# Patient Record
Sex: Male | Born: 1970 | Hispanic: Yes | Marital: Single | Smoking: Never smoker
Health system: Southern US, Community
[De-identification: ages and names within clinical notes are randomized; demographics above are authoritative.]

## PROBLEM LIST (undated history)

## (undated) DIAGNOSIS — N200 Calculus of kidney: Secondary | ICD-10-CM

## (undated) HISTORY — PX: LITHOTRIPSY: SUR834

---

## 2006-09-13 ENCOUNTER — Emergency Department: Payer: Self-pay | Admitting: Unknown Physician Specialty

## 2010-01-15 ENCOUNTER — Emergency Department: Payer: Self-pay | Admitting: Emergency Medicine

## 2010-01-15 IMAGING — CT CT STONE STUDY
1 of 2 series · 16 of 32 positions shown, 20 images · non-contrast
Comparison: none

REASON FOR EXAM: left flank pain, nausea
COMMENTS:

PROCEDURE:     CT  - CT ABDOMEN /PELVIS WO (STONE)  - [DATE]  [DATE]
RESULT:     History history: Left flank pain.
Comparison studies: No prior.

[Series 2: stone · axial · 0.72mm/px · z∈[-476,-14]mm · 16 of 168 slices shown, 20 images]
[im 7/168  soft-tissue]
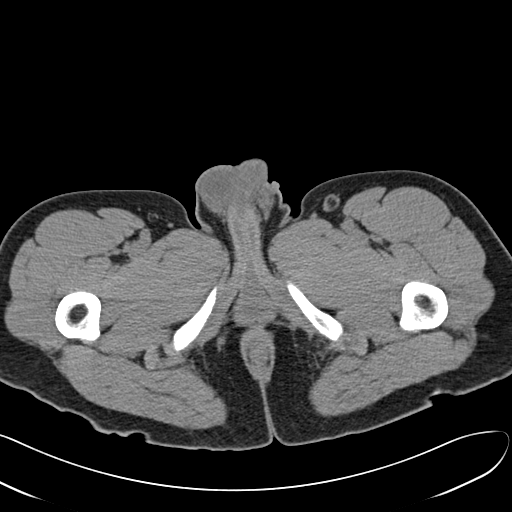
[im 7/168  bone]
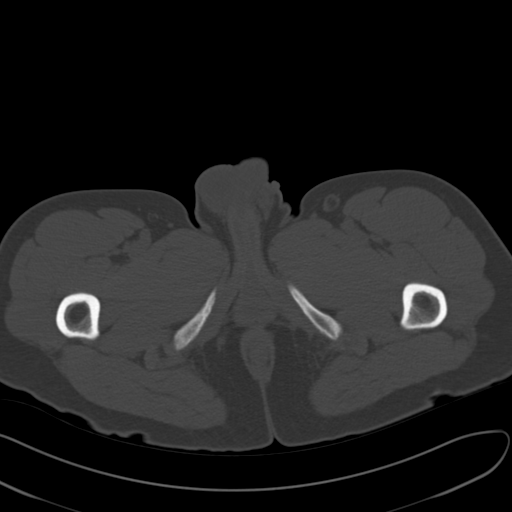
[im 19/168  soft-tissue]
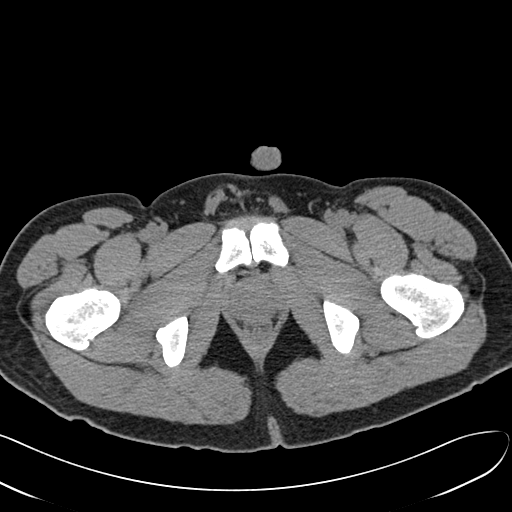
[im 31/168  soft-tissue]
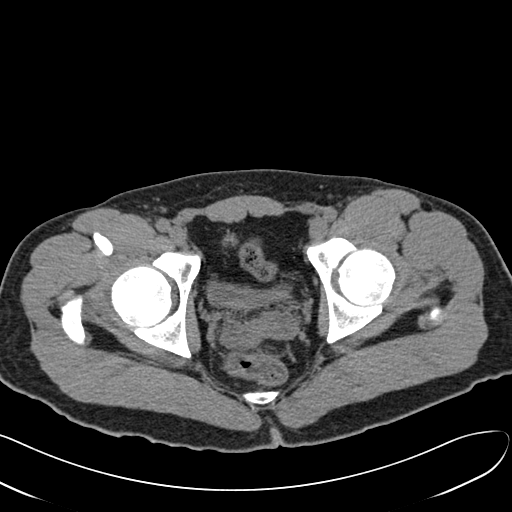
[im 44/168  soft-tissue]
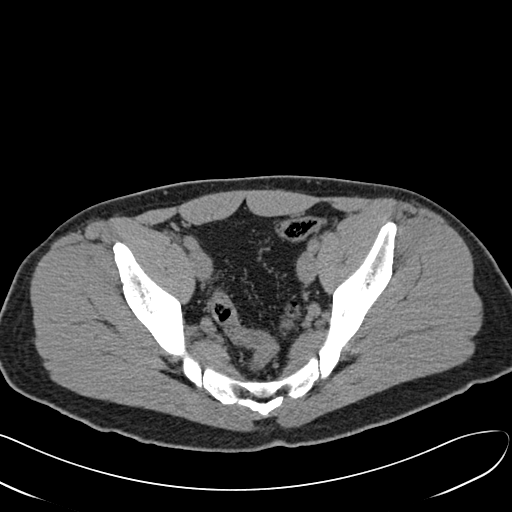
[im 56/168  soft-tissue]
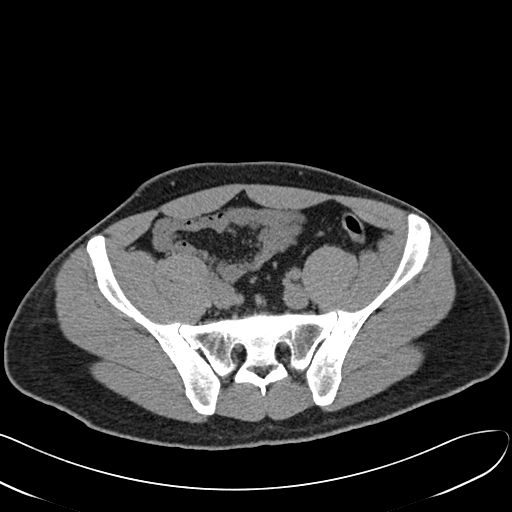
[im 69/168  soft-tissue]
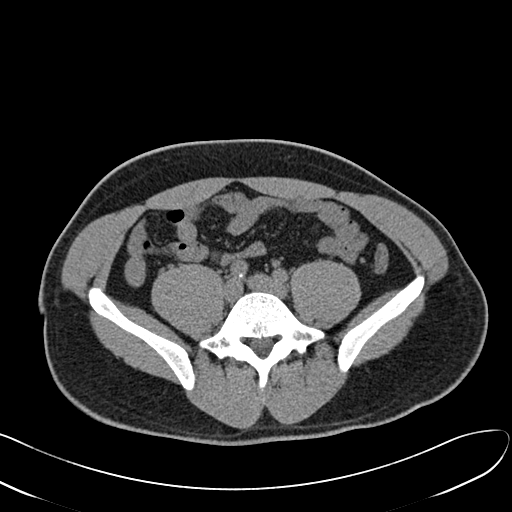
[im 81/168  soft-tissue]
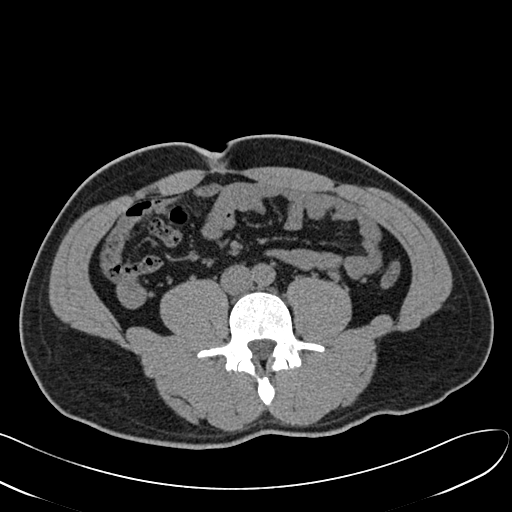
[im 87/168  soft-tissue]
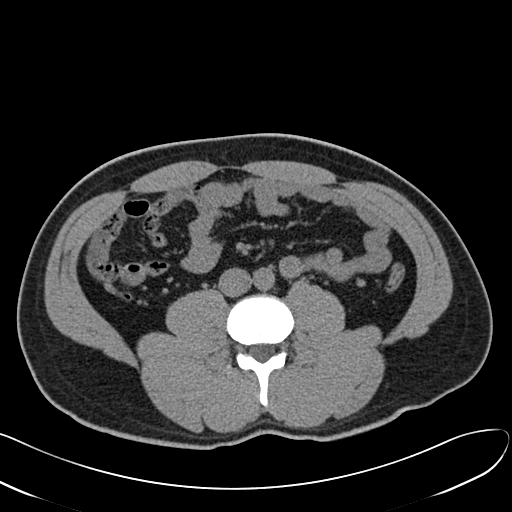
[im 99/168  soft-tissue]
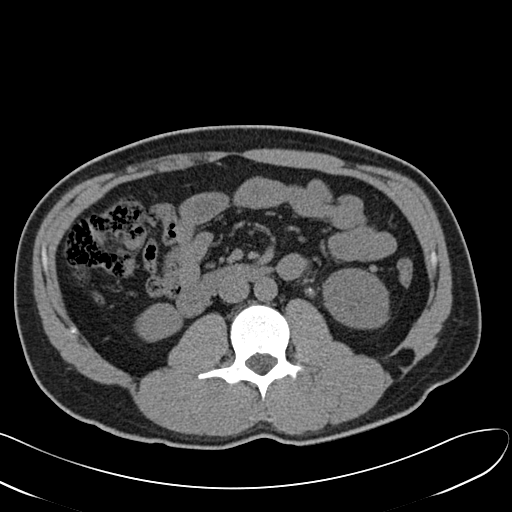
[im 99/168  bone]
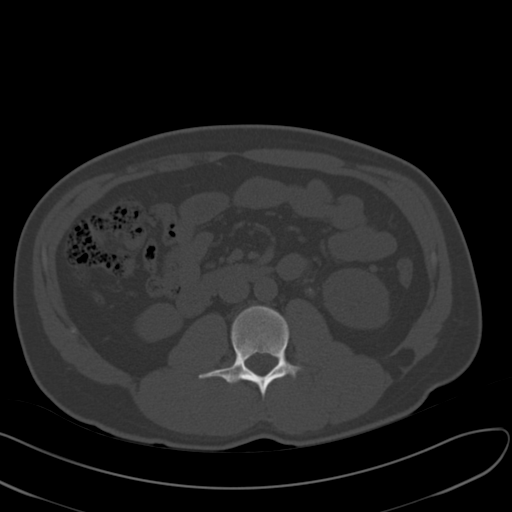
[im 112/168  soft-tissue]
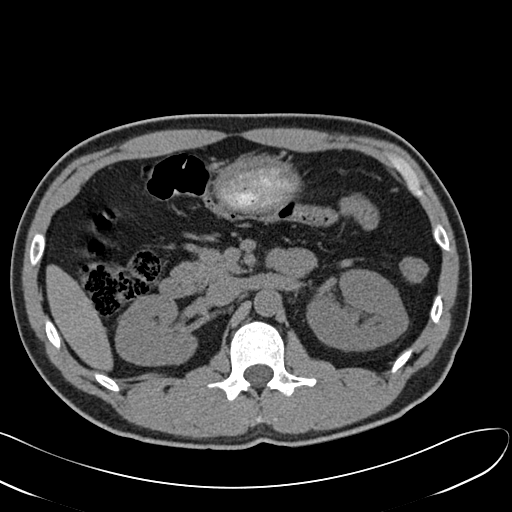
[im 124/168  soft-tissue]
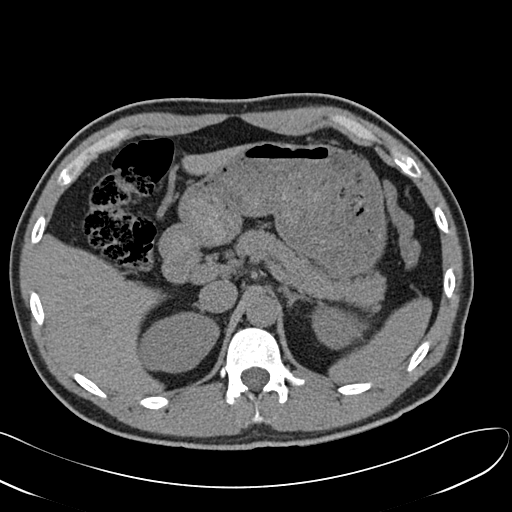
[im 137/168  soft-tissue]
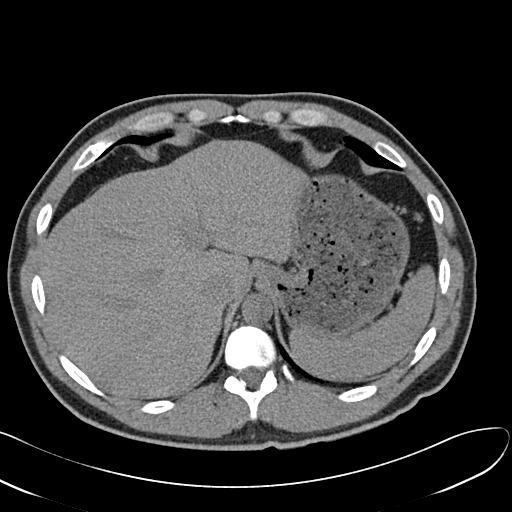
[im 143/168  lung]
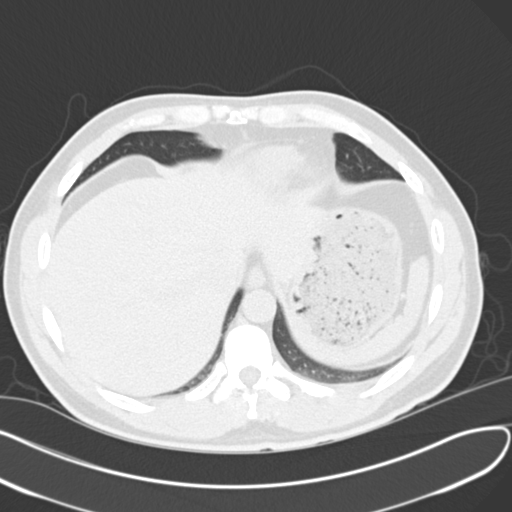
[im 149/168  soft-tissue]
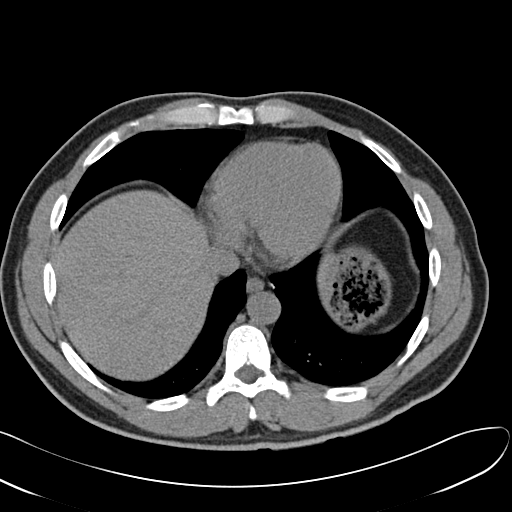
[im 149/168  lung]
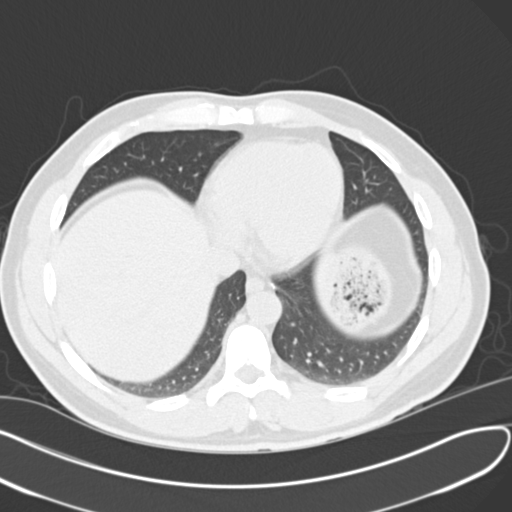
[im 155/168  lung]
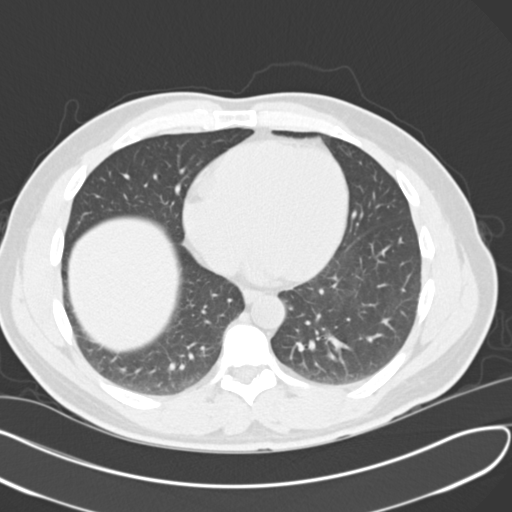
[im 161/168  soft-tissue]
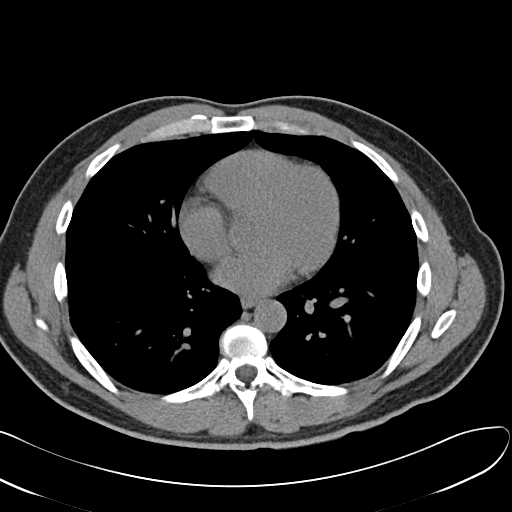
[im 161/168  lung]
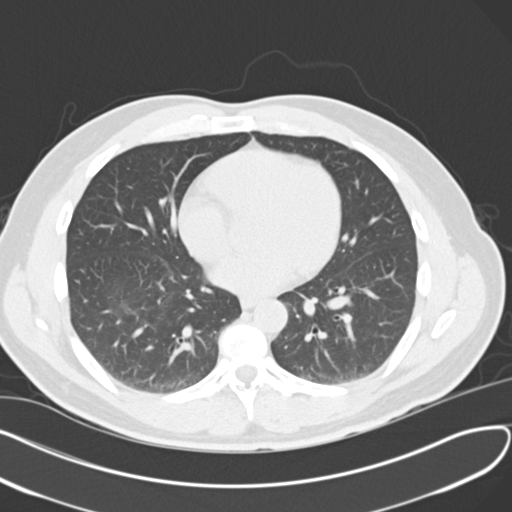

[16 of 32 positions shown; findings below may reference images not displayed]

FINDINGS: Standard nonenhanced scan obtained. Liver normal. Gallbladder
nondistended. Spleen normal. Pancreas normal. Adrenals normal. Bilateral
nephrolithiasis present. A 4 to 5 mm stone is present in the proximal left
ureter with mild hydronephrosis. Tiny stone noted in the bladder consistent
recently passed stone. Bladder nondistended. Aorta nondistended. Lung bases
clear. No free air. No bowel distention. Lower quadrants are unremarkable.
IMPRESSION: 4 to 5 mm stone proximal left ureter with mild
hydronephrosis. Tiny stone fragment noted in the bladder consistent with
recently passed stone. Bilateral nephrolithiasis.

## 2010-01-17 ENCOUNTER — Emergency Department: Payer: Self-pay | Admitting: Emergency Medicine

## 2010-01-20 ENCOUNTER — Ambulatory Visit: Payer: Self-pay | Admitting: Urology

## 2010-01-20 IMAGING — CR DG ABDOMEN 1V
1 series · 2 of 2 positions shown · non-contrast
Comparison: none

REASON FOR EXAM: nephrolithiasis pt need films
COMMENTS:

[Series 1: view not recorded · 0.17mm/px · 2 of 2 slices shown]
[im 1/2]
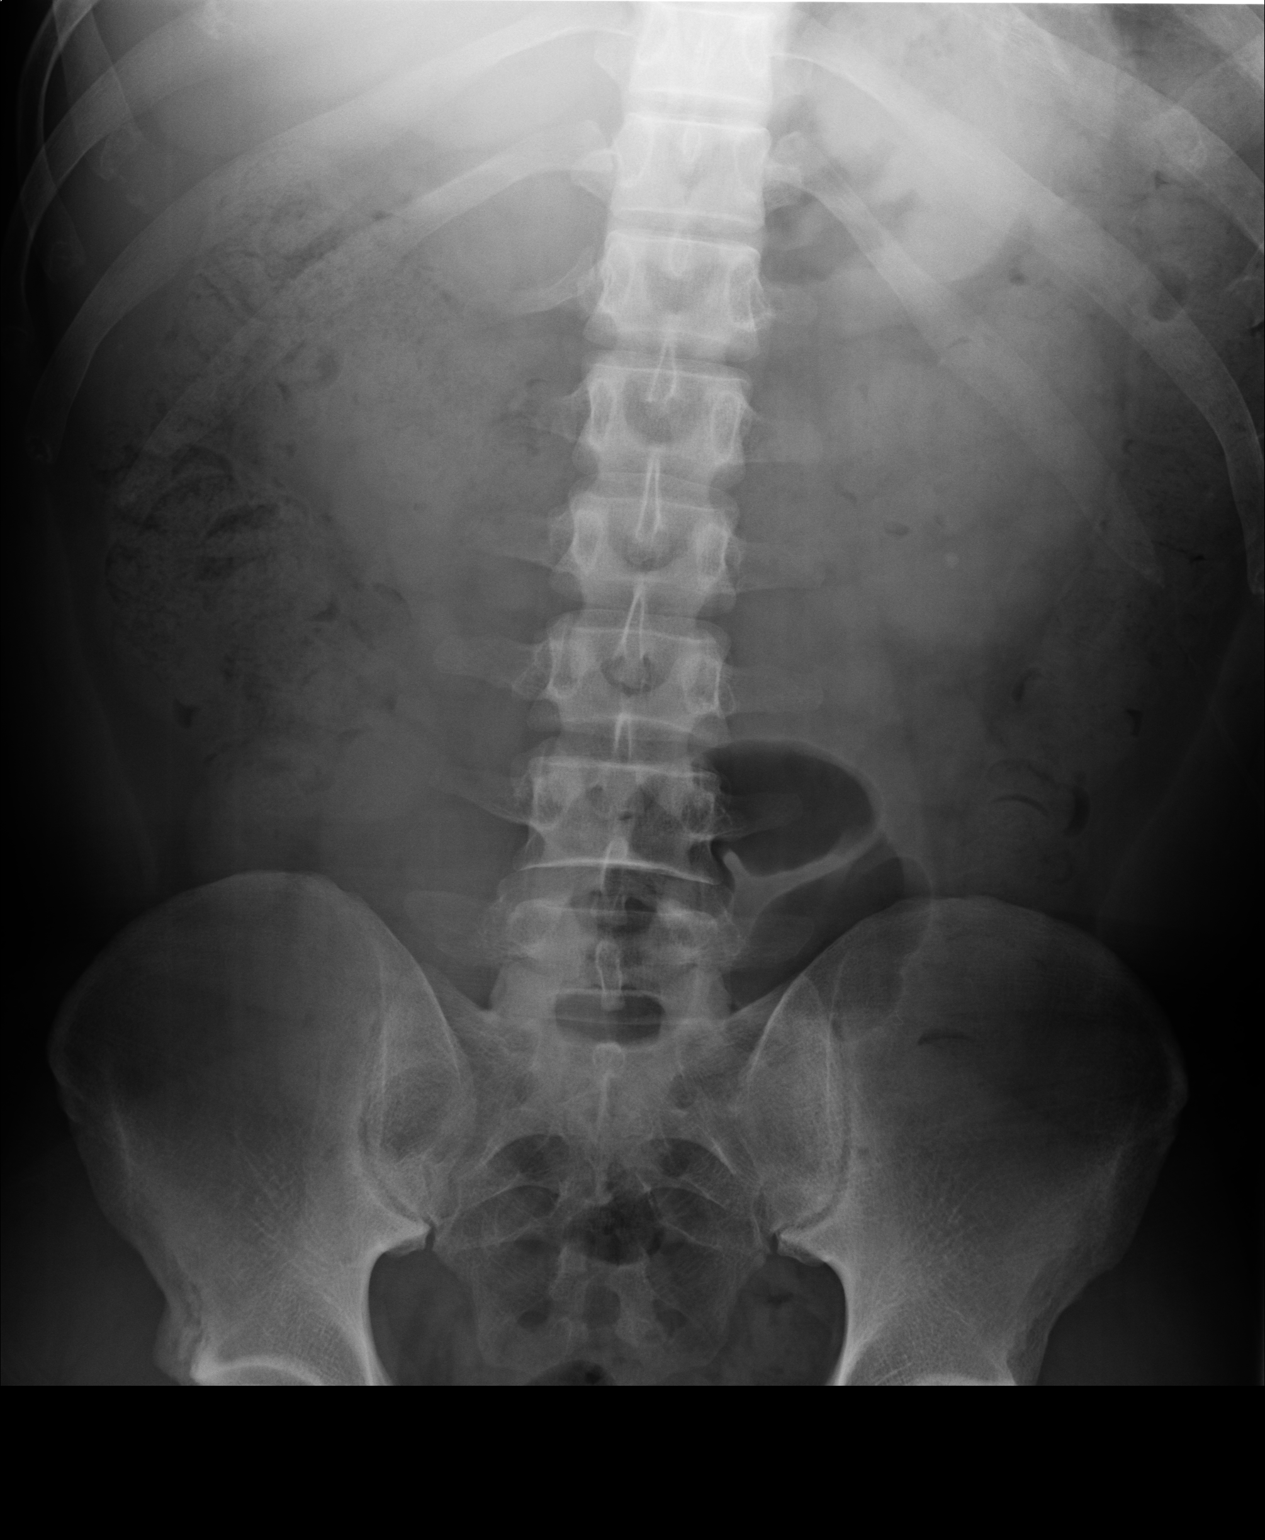
[im 2/2]
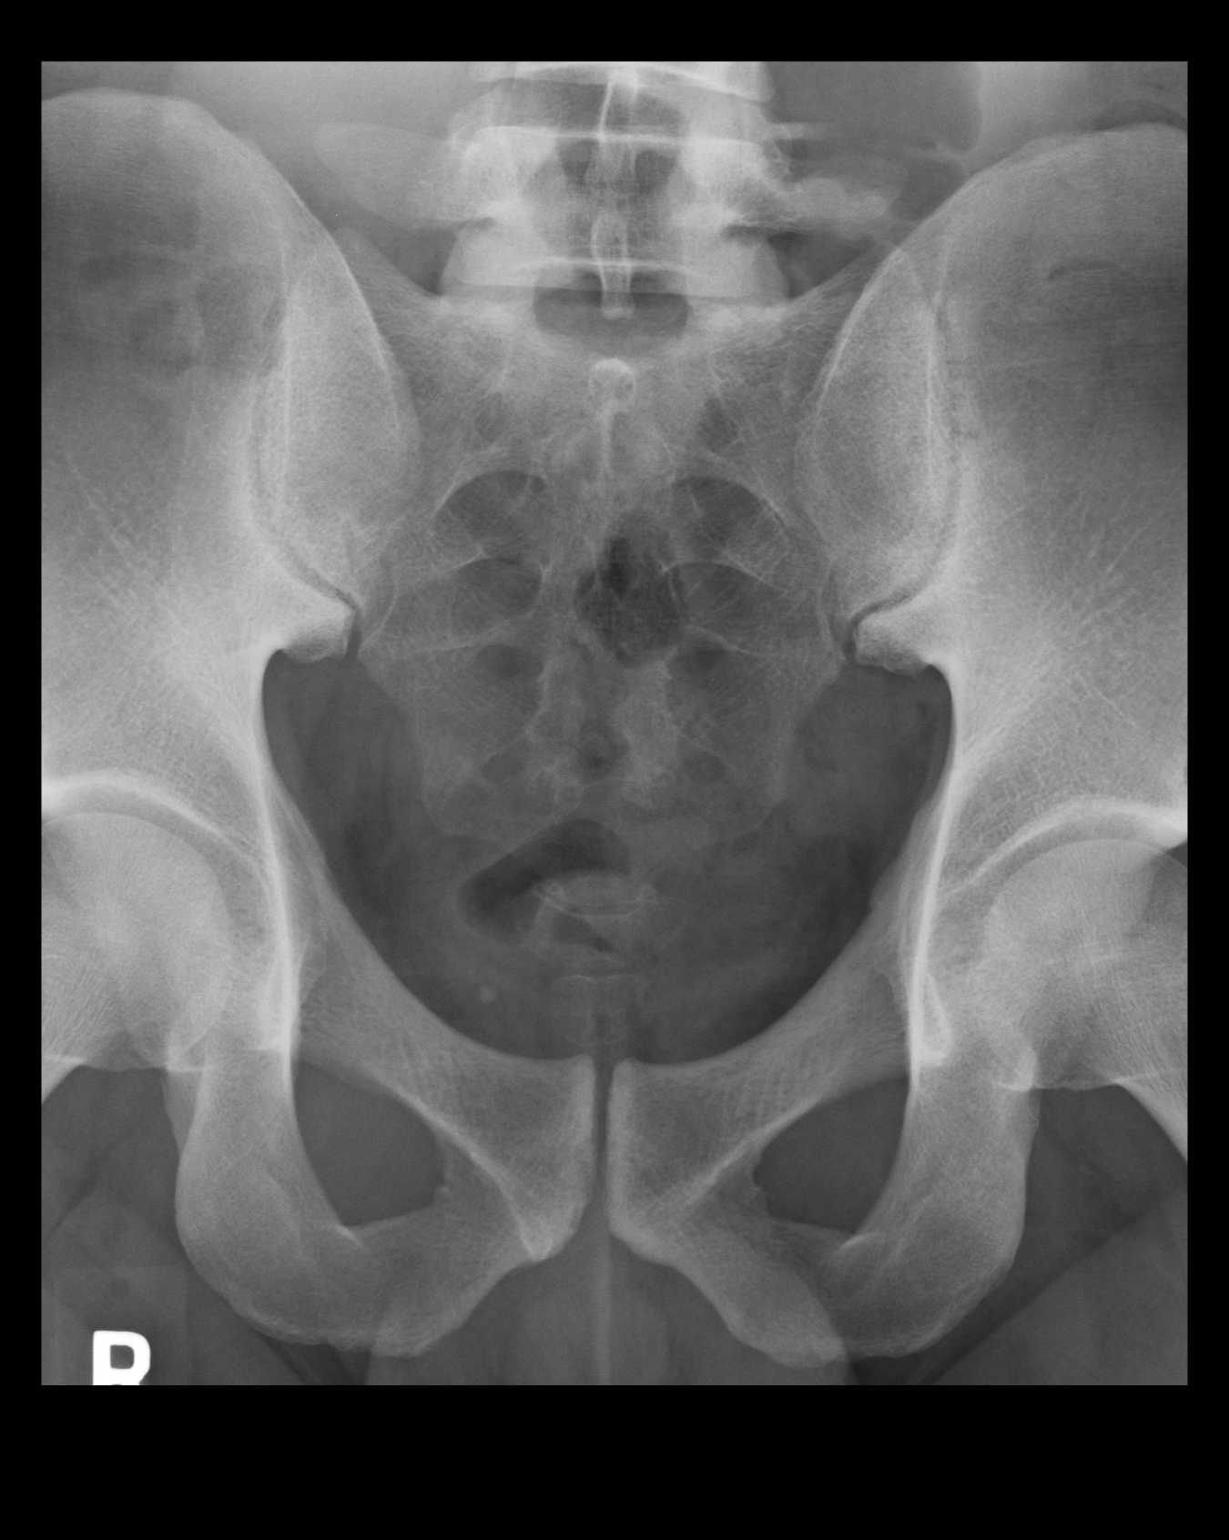

[2 of 2 positions shown; findings below may reference images not displayed]

PROCEDURE:     DXR - DXR KIDNEY URETER BLADDER  - [DATE]  [DATE]

RESULT:     Images of the abdomen demonstrate calcific density in the right
pelvic region suggestive of a phlebolith. There is evidence of left
nephrolithiasis in the lower pole of the left kidney. No definite ureteral
stones are appreciated. The bowel gas pattern is unremarkable.
IMPRESSION: Left nephrolithiasis.

## 2010-01-24 ENCOUNTER — Ambulatory Visit: Payer: Self-pay | Admitting: Urology

## 2010-01-25 ENCOUNTER — Ambulatory Visit: Payer: Self-pay | Admitting: Urology

## 2010-06-29 ENCOUNTER — Ambulatory Visit: Payer: Self-pay | Admitting: Urology

## 2010-06-29 IMAGING — CR DG ABDOMEN 1V
1 series · 1 of 1 positions shown · non-contrast
Comparison: none

REASON FOR EXAM: nephrolithaisis pt need films
COMMENTS:

[view not recorded]
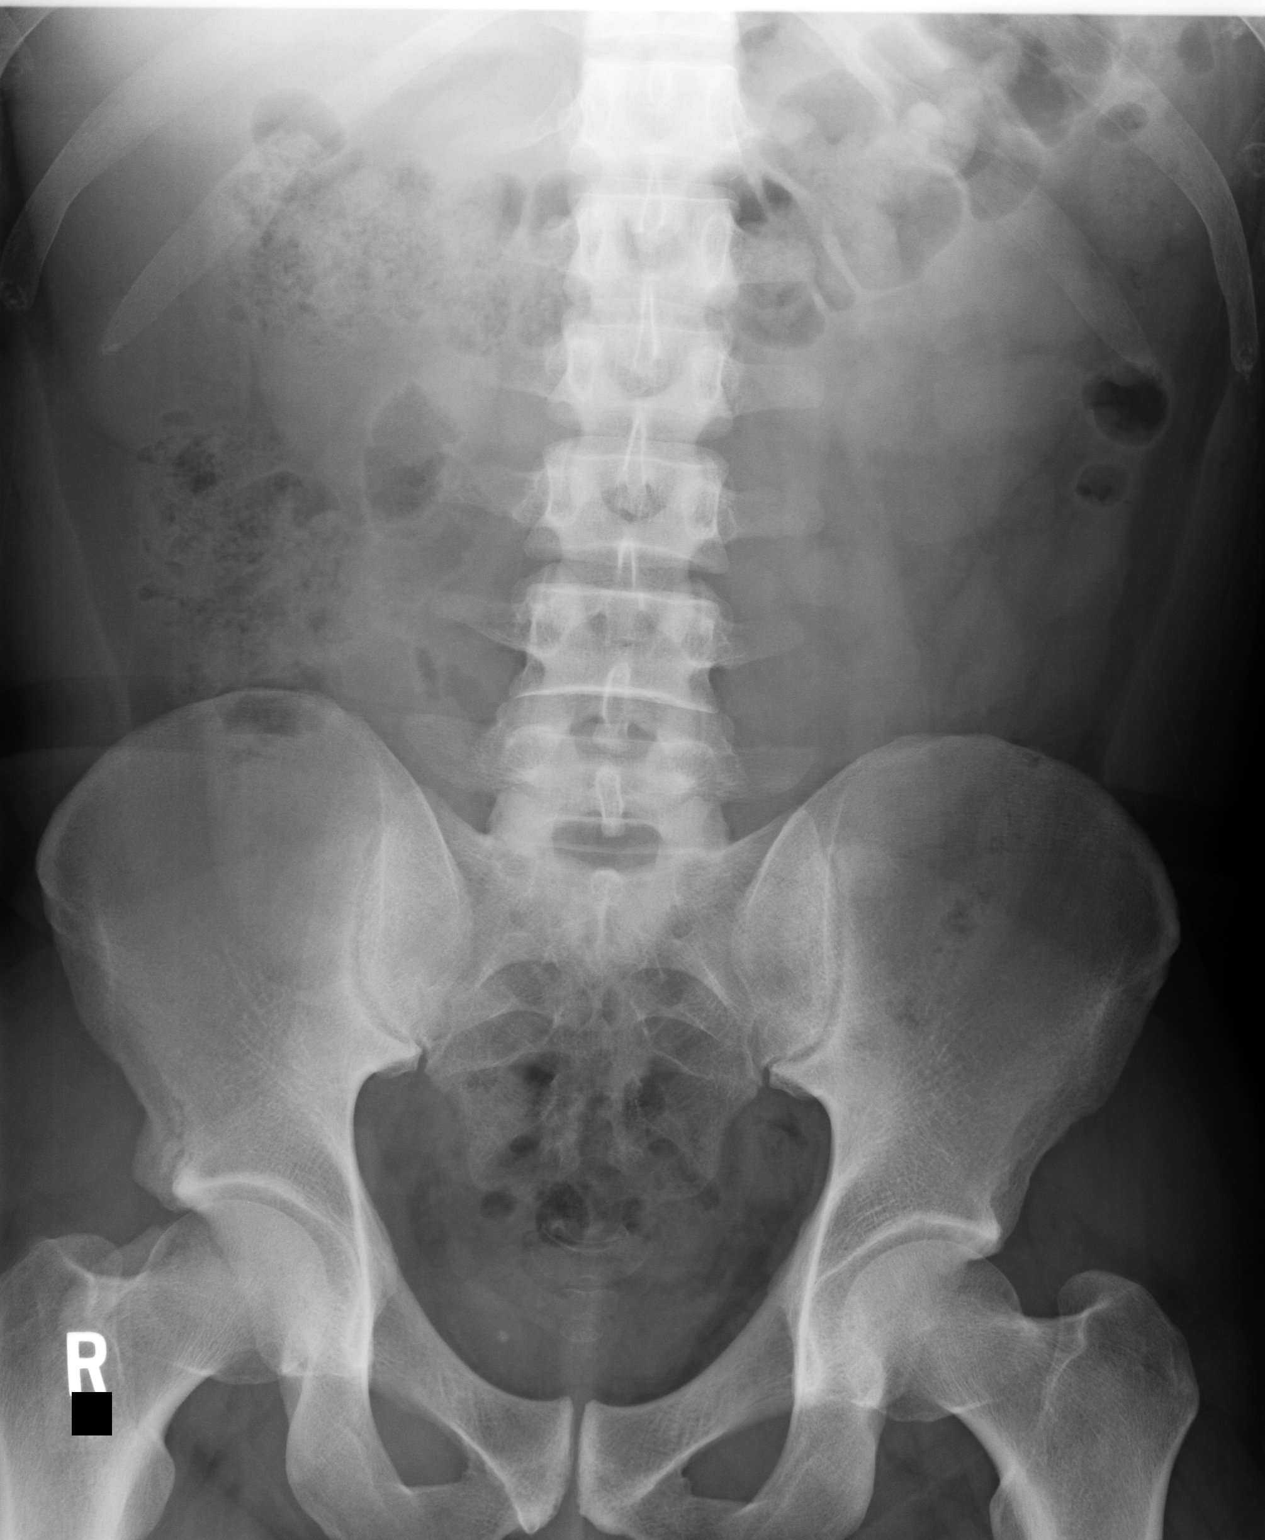

[1 of 1 positions shown; findings below may reference images not displayed]

PROCEDURE:     DXR - DXR KIDNEY URETER BLADDER  - [DATE]  [DATE]

RESULT:     Comparison is made to the prior exam of [DATE].

The previously noted left renal stones are no longer definitely identified.
No right renal stones are noted. No ureteral calcifications are seen on
either side. A phlebolith is again noted in the right pelvis.
IMPRESSION: 1.  No definite renal or ureteral calcifications are identified.
2.  The bowel gas pattern is normal.
3.  There is a moderate amount of fecal material in the colon.

## 2013-09-27 DIAGNOSIS — Z87442 Personal history of urinary calculi: Secondary | ICD-10-CM | POA: Insufficient documentation

## 2015-10-12 ENCOUNTER — Other Ambulatory Visit: Payer: Self-pay | Admitting: Family Medicine

## 2015-10-12 DIAGNOSIS — R1032 Left lower quadrant pain: Secondary | ICD-10-CM

## 2015-10-22 ENCOUNTER — Ambulatory Visit
Admission: RE | Admit: 2015-10-22 | Discharge: 2015-10-22 | Disposition: A | Discharge status: Home / Self Care | Payer: BLUE CROSS/BLUE SHIELD | Source: Ambulatory Visit | Attending: Family Medicine | Admitting: Family Medicine

## 2015-10-22 DIAGNOSIS — N200 Calculus of kidney: Secondary | ICD-10-CM | POA: Diagnosis not present

## 2015-10-22 DIAGNOSIS — R1032 Left lower quadrant pain: Secondary | ICD-10-CM | POA: Diagnosis present

## 2015-10-22 IMAGING — CT CT ABD-PELV W/ CM
2 of 5 series · 16 of 46 positions shown, 18 images · IV contrast (APPLIED)
Comparison: [DATE]

CLINICAL DATA: Pt states left lower quadrant pain over the last 3-4
months. Pt states pain level varies. Pt denies any N/V/D. Pt denies
any hx of surgery to abd/pelvis.

EXAM:
CT ABDOMEN AND PELVIS WITH CONTRAST
TECHNIQUE: Multidetector CT imaging of the abdomen and pelvis was performed
using the standard protocol following bolus administration of
intravenous contrast.
CONTRAST:  100mL [ZX] IOPAMIDOL ([ZX]) INJECTION 61%

[Series 2: axial st · axial · 0.80mm/px · z∈[-614,-154]mm · 13 of 104 slices shown, 15 images]
[im 6/104  soft-tissue]
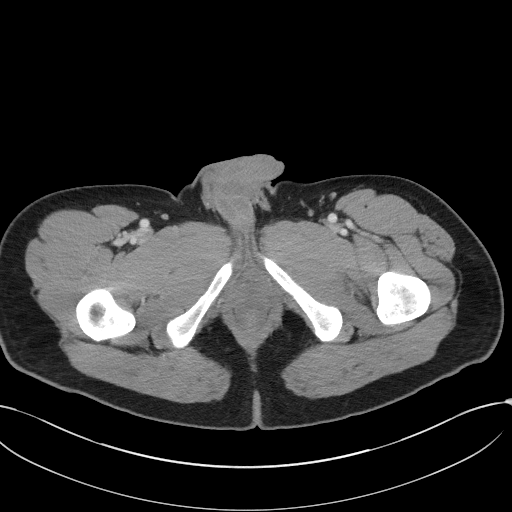
[im 6/104  bone]
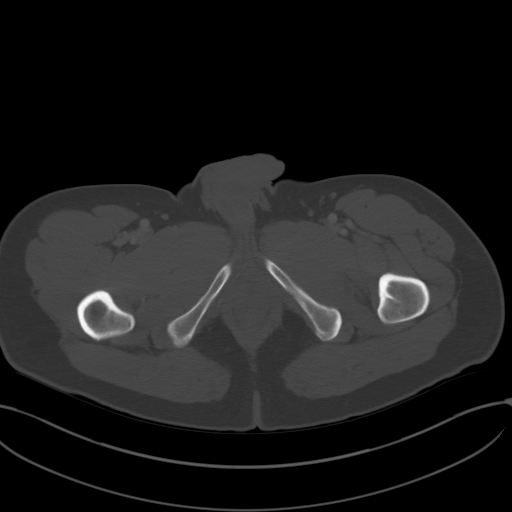
[im 16/104  soft-tissue]
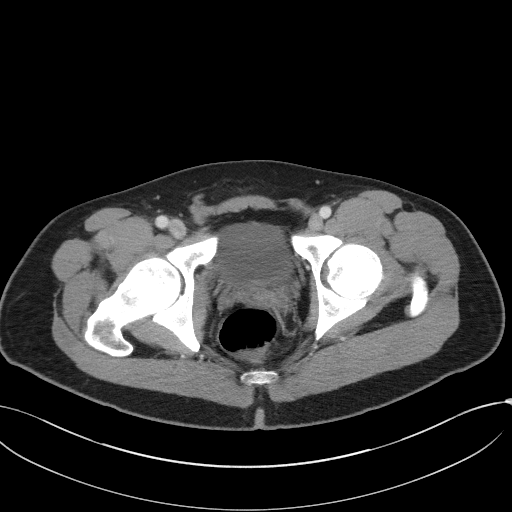
[im 21/104  soft-tissue]
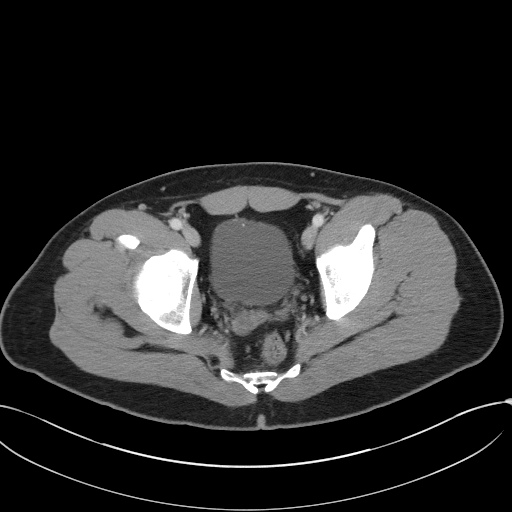
[im 31/104  soft-tissue]
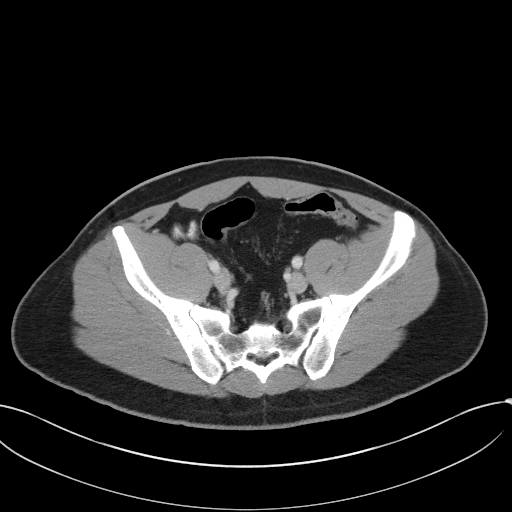
[im 37/104  soft-tissue]
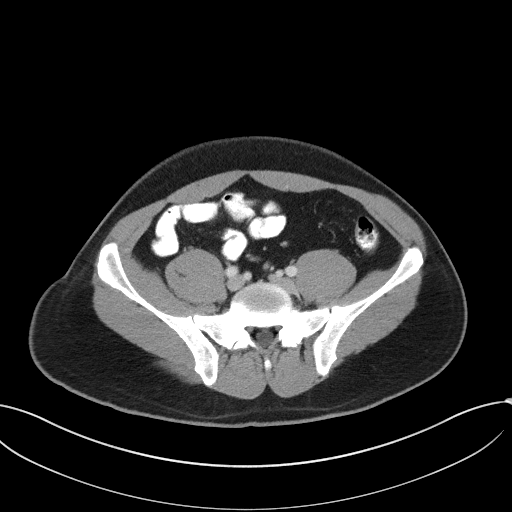
[im 47/104  soft-tissue]
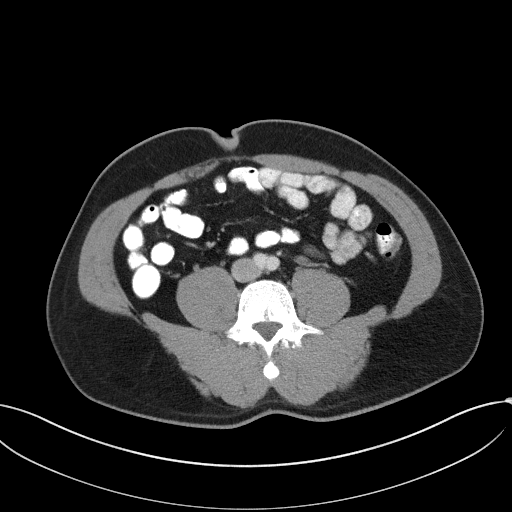
[im 52/104  soft-tissue]
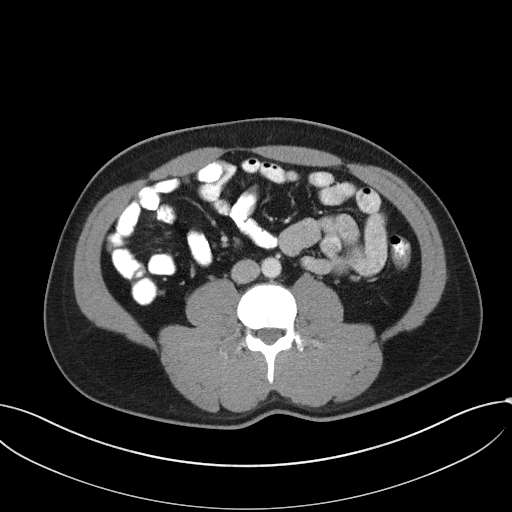
[im 57/104  soft-tissue]
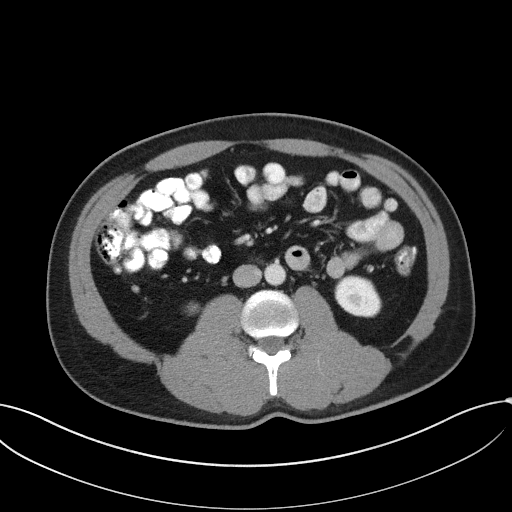
[im 67/104  soft-tissue]
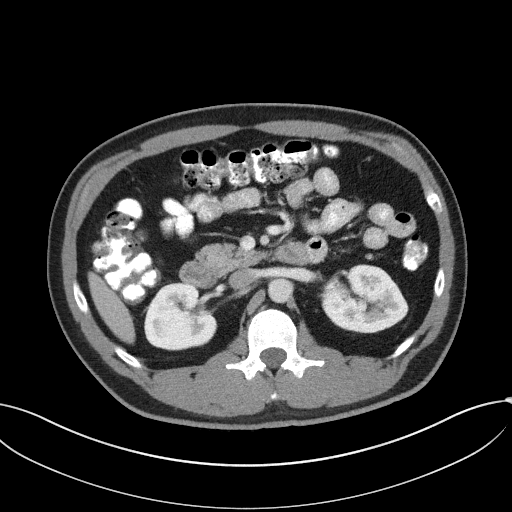
[im 67/104  bone]
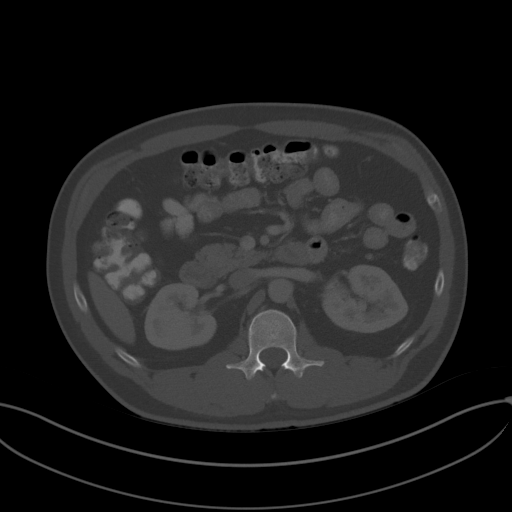
[im 73/104  soft-tissue]
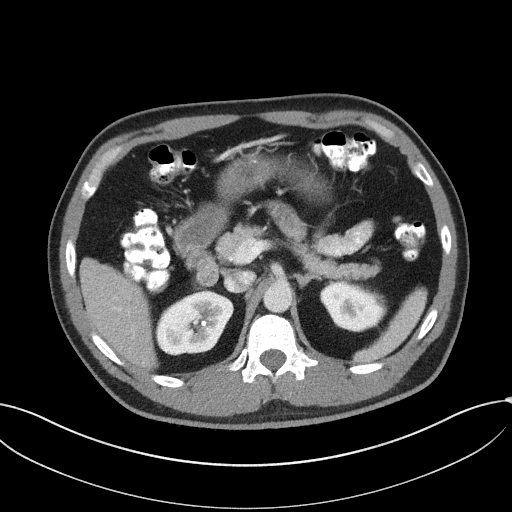
[im 83/104  soft-tissue]
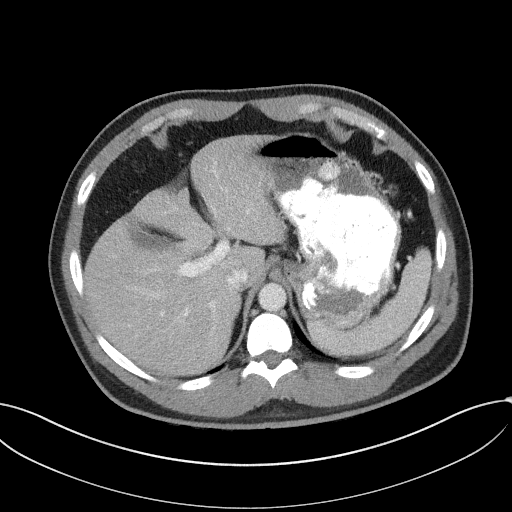
[im 88/104  soft-tissue]
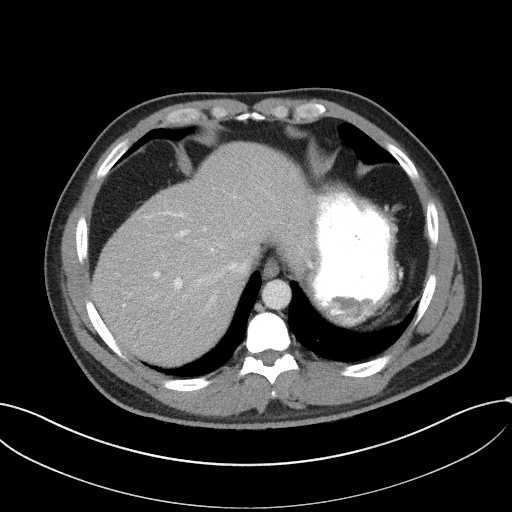
[im 98/104  soft-tissue]
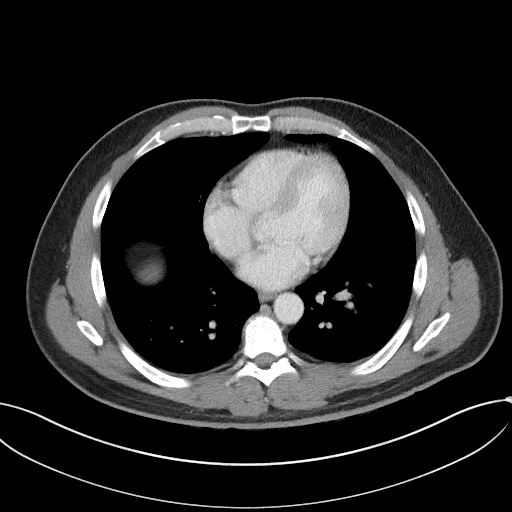

[Series 5: coronal st · coronal · 0.69mm/px · 3 of 85 slices shown]
[im 29/85  soft-tissue]
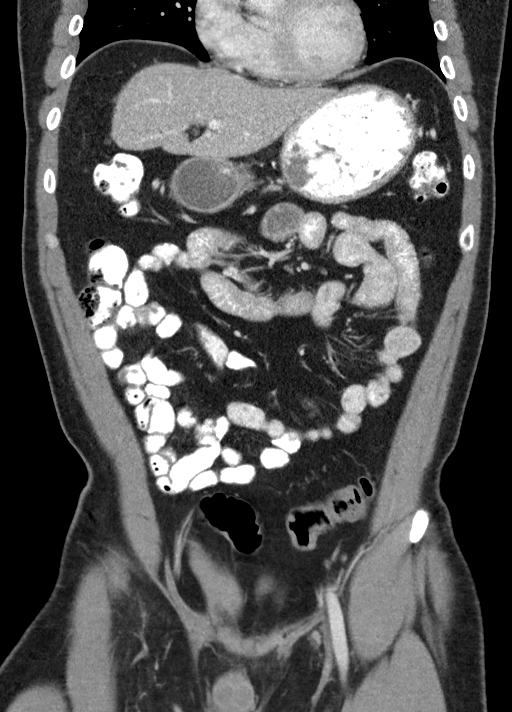
[im 38/85  soft-tissue]
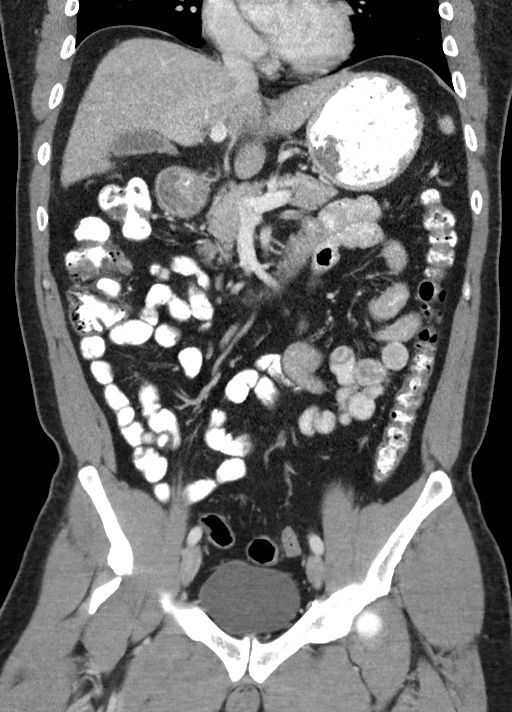
[im 47/85  soft-tissue]
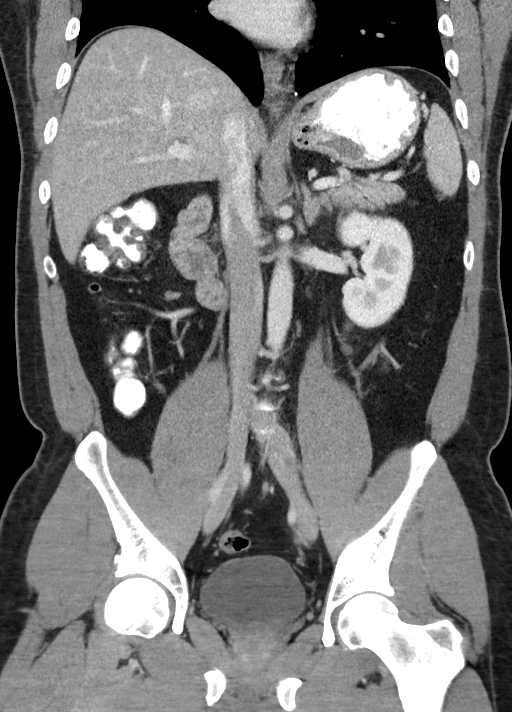

[16 of 46 positions shown; findings below may reference images not displayed]

FINDINGS: Lower chest: Minimal dependent atelectasis in the visualized lung
bases. No pleural or pericardial effusion.

Hepatobiliary: No focal liver abnormality is seen. No gallstones,
gallbladder wall thickening, or biliary dilatation.

Pancreas: Unremarkable. No pancreatic ductal dilatation or
surrounding inflammatory changes.

Spleen: Normal in size without focal abnormality.

Adrenals/Urinary Tract: Adrenal glands unremarkable. No renal
parenchymal lesion. Scattered bilateral renal calculi measured up to
4 mm on the right, 3 mm left. No hydronephrosis or ureterectasis.
Urinary bladder incompletely distended. Stable punctate
calcification along the anterior bladder wall.

Stomach/Bowel: Stomach physiologically distended. Small bowel
nondilated. Normal appendix. Colon unremarkable.

Vascular/Lymphatic: No significant vascular findings are present. No
enlarged abdominal or pelvic lymph nodes.

Reproductive: Prostate is unremarkable.

Other: No ascites.  No free air.

Musculoskeletal: No acute or significant osseous findings.
IMPRESSION: 1. No acute findings.
2. Bilateral nephrolithiasis without hydronephrosis.

## 2015-10-22 MED ORDER — IOPAMIDOL (ISOVUE-300) INJECTION 61%
100.0000 mL | Freq: Once | INTRAVENOUS | Status: AC | PRN
  Administered 2015-10-22: 100 mL via INTRAVENOUS

## 2017-03-30 ENCOUNTER — Encounter: Payer: Self-pay | Admitting: Urology

## 2017-03-30 ENCOUNTER — Ambulatory Visit: Payer: BLUE CROSS/BLUE SHIELD | Admitting: Urology

## 2017-03-30 VITALS — BP 120/75 | HR 80 | Resp 16 | Ht 67.0 in | Wt 194.2 lb

## 2017-03-30 DIAGNOSIS — N200 Calculus of kidney: Secondary | ICD-10-CM

## 2017-03-30 NOTE — Progress Notes (Signed)
03/30/2017 9:40 AM   Tyler Watson 12-Oct-1970 [id number]  Referring provider: [facility] [address] [place], [place] [address]  Chief complaint: Kidney stone  HPI: Tyler Watson is a 47 year old male seen in consultation at the request of Dr. Ludwig Clarks for evaluation of nephrolithiasis.  He was initially seen on 01/22/2017 with a 2-day history of left abdominal pain.  The pain was severe and in the left flank which radiated to the mid back.  He had passed a small stone and was having less severe symptoms.  It was felt the symptoms were secondary to acute diverticulitis and he was treated with antibiotic therapy. He was seen in follow-up on 02/05/2017 after developing urinary frequency and dysuria.  He was felt to have prostatitis and was treated with a 2-week course of Cipro.  His urinalysis at that visit showed 1+ blood and a urine culture was negative.  He was seen in follow-up on 2/18 with resolution of his symptoms.  He was noted to have 5 RBCs on his urinalysis.  A CT scan of the abdomen pelvis with and without contrast was ordered and performed on 03/07/2017 and was remarkable for bilateral calyceal calculi the largest measuring 5 mm.  The report states multiple however does not estimate the number.  He is presently asymptomatic. He has had a prior ureteroscopic stone removal.   PMH: History reviewed. No pertinent past medical history.  Surgical History: Past Surgical History:  Procedure Laterality Date  . LITHOTRIPSY      Home Medications:  Allergies as of 03/30/2017   No Known Allergies     Medication List    as of 03/30/2017  9:40 AM   You have not been prescribed any medications.     Allergies: No Known Allergies  Family History: Family History  Problem Relation Age of Onset  . Hypertension Mother   . Heart attack Father 37    Social History:  reports that he has never smoked. He has never used smokeless tobacco. He reports that he drank  alcohol. He reports that he does not use drugs.  ROS: UROLOGY Frequent Urination?: Yes Hard to postpone urination?: No Burning/pain with urination?: Yes Get up at night to urinate?: Yes Leakage of urine?: No Urine stream starts and stops?: No Trouble starting stream?: No Do you have to strain to urinate?: No Blood in urine?: No Urinary tract infection?: No Sexually transmitted disease?: No Injury to kidneys or bladder?: No Painful intercourse?: No Weak stream?: No Erection problems?: No Penile pain?: No  Gastrointestinal Nausea?: No Vomiting?: No Indigestion/heartburn?: No Diarrhea?: No Constipation?: No  Constitutional Fever: No Night sweats?: No Weight loss?: No Fatigue?: No  Skin Skin rash/lesions?: No Itching?: No  Eyes Blurred vision?: No Double vision?: No  Ears/Nose/Throat Sore throat?: No Sinus problems?: No  Hematologic/Lymphatic Swollen glands?: No Easy bruising?: No  Cardiovascular Leg swelling?: No Chest pain?: No  Respiratory Cough?: No Shortness of breath?: No  Endocrine Excessive thirst?: No  Musculoskeletal Back pain?: No Joint pain?: No  Neurological Headaches?: No Dizziness?: No  Psychologic Depression?: No Anxiety?: No  Physical Exam: BP 120/75   Pulse 80   Resp 16   Ht 5\' 7"  (1.702 m)   Wt 194 lb 3.2 oz (88.1 kg)   SpO2 98%   BMI 30.42 kg/m   Constitutional:  Alert and oriented, No acute distress. HEENT: [place] AT, moist mucus membranes.  Trachea midline, no masses. Cardiovascular: No clubbing, cyanosis, or edema. Respiratory: Normal respiratory  effort, no increased work of breathing. GI: Abdomen is soft, nontender, nondistended, no abdominal masses GU: No CVA tenderness Lymph: No cervical or inguinal lymphadenopathy. Skin: No rashes, bruises or suspicious lesions. Neurologic: Grossly intact, no focal deficits, moving all 4 extremities. Psychiatric: Normal mood and affect.    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  His initial presentation was probably secondary to a ureteral calculus that subsequently passed and not prostatitis.  CT report remarkable for multiple bilateral renal calculi however the CT was not available for review.  Have recommended a KUB.  He was informed that nonobstructing calculi do not cause pain.  We discussed options of surveillance, shockwave lithotripsy and ureteroscopic removal.  I have also recommended proceeding with a metabolic evaluation.  He will be notified with his KUB results and further recommendations.   Riki AltesScott C Stoioff, MD  [facility] [address], [address] [place], [place] [id number] [phone]

## 2017-04-10 ENCOUNTER — Ambulatory Visit
Admission: RE | Admit: 2017-04-10 | Discharge: 2017-04-10 | Disposition: A | Discharge status: Home / Self Care | Payer: BLUE CROSS/BLUE SHIELD | Source: Ambulatory Visit | Attending: Urology | Admitting: Urology

## 2017-04-10 DIAGNOSIS — N200 Calculus of kidney: Secondary | ICD-10-CM | POA: Diagnosis present

## 2017-04-10 IMAGING — CR DG ABDOMEN 1V
1 series · 1 of 1 positions shown · non-contrast
Comparison: CT abdomen and pelvis [DATE].

CLINICAL DATA: History of bilateral renal stones. Patient may
undergo lithotripsy.

EXAM:
ABDOMEN - 1 VIEW

[dg abd 1 view]
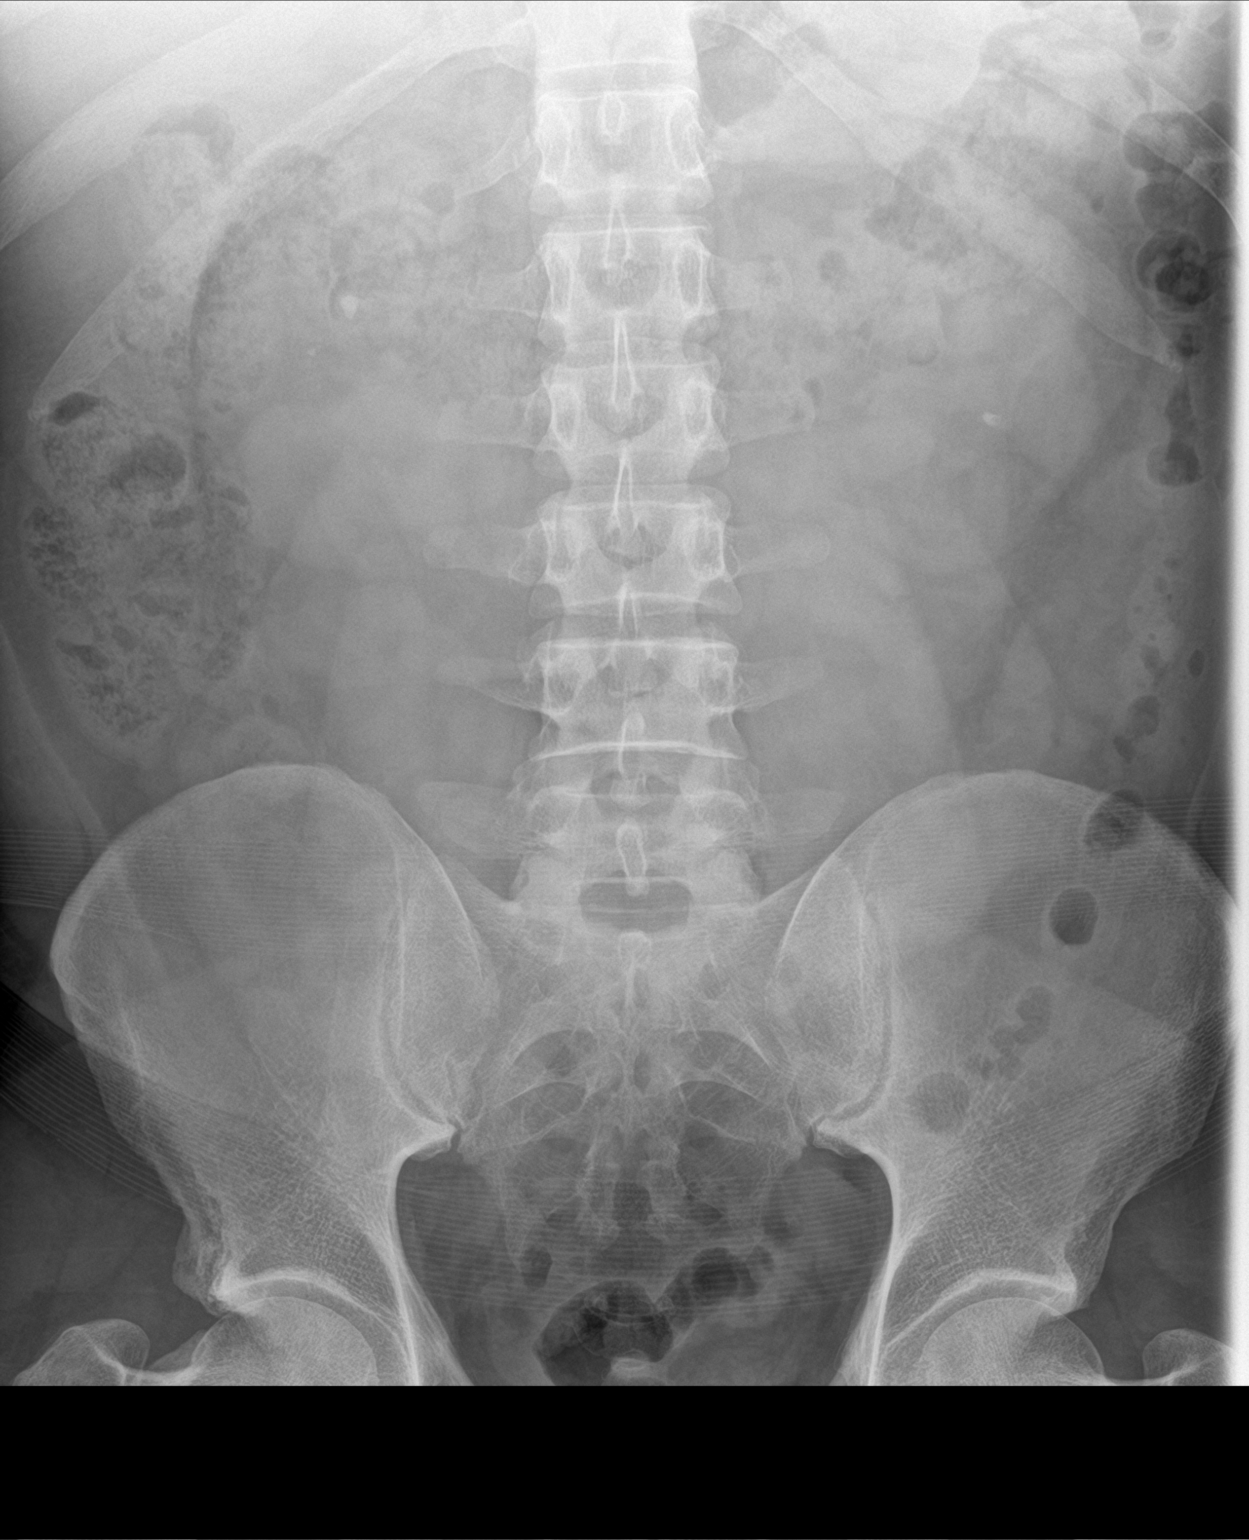

[1 of 1 positions shown; findings below may reference images not displayed]

FINDINGS: A single stone in the lower pole of the left kidney measures 0.5 cm
in diameter. Two right renal stones are seen. The larger measures
0.5 cm in diameter. No evidence of ureteral or urinary bladder stone
is seen. Bowel gas pattern is normal. No bony abnormality.
IMPRESSION: Single left and 2 right renal stones as described above. No evidence
of ureteral or urinary bladder stone.

## 2017-04-12 ENCOUNTER — Telehealth: Payer: Self-pay

## 2017-04-12 NOTE — Telephone Encounter (Signed)
-----   Message from Riki AltesScott C Stoioff, MD sent at 04/11/2017  1:22 PM EDT ----- KUB was reviewed and there are 2 stones in his right kidney and one in the left kidney.  These can be monitored or treated with shockwave lithotripsy however only one kidney can be done at a time.  Would definitely proceed with the metabolic evaluation discussed at last visit.

## 2017-04-12 NOTE — Telephone Encounter (Signed)
Not able to get in touch with pt. Will send a letter.  

## 2018-01-08 ENCOUNTER — Emergency Department
Admission: EM | Admit: 2018-01-08 | Discharge: 2018-01-09 | Disposition: A | Discharge status: Home / Self Care | Payer: BLUE CROSS/BLUE SHIELD | Attending: Emergency Medicine | Admitting: Emergency Medicine

## 2018-01-08 ENCOUNTER — Emergency Department: Payer: BLUE CROSS/BLUE SHIELD

## 2018-01-08 ENCOUNTER — Other Ambulatory Visit: Payer: Self-pay

## 2018-01-08 DIAGNOSIS — R109 Unspecified abdominal pain: Secondary | ICD-10-CM | POA: Diagnosis present

## 2018-01-08 DIAGNOSIS — N202 Calculus of kidney with calculus of ureter: Secondary | ICD-10-CM | POA: Insufficient documentation

## 2018-01-08 DIAGNOSIS — N200 Calculus of kidney: Secondary | ICD-10-CM

## 2018-01-08 HISTORY — DX: Calculus of kidney: N20.0

## 2018-01-08 LAB — URINALYSIS, COMPLETE (UACMP) WITH MICROSCOPIC
Bacteria, UA: NONE SEEN
Bilirubin Urine: NEGATIVE
Glucose, UA: NEGATIVE mg/dL
Ketones, ur: NEGATIVE mg/dL
Leukocytes, UA: NEGATIVE
Nitrite: NEGATIVE
Protein, ur: NEGATIVE mg/dL
Specific Gravity, Urine: 1.019 (ref 1.005–1.030)
Squamous Epithelial / HPF: NONE SEEN (ref 0–5)
pH: 6 (ref 5.0–8.0)

## 2018-01-08 LAB — CBC WITH DIFFERENTIAL/PLATELET
Abs Immature Granulocytes: 0.03 10*3/uL (ref 0.00–0.07)
Basophils Absolute: 0.1 10*3/uL (ref 0.0–0.1)
Basophils Relative: 1 %
Eosinophils Absolute: 0.2 10*3/uL (ref 0.0–0.5)
Eosinophils Relative: 2 %
HCT: 42.5 % (ref 39.0–52.0)
Hemoglobin: 14.7 g/dL (ref 13.0–17.0)
Immature Granulocytes: 0 %
Lymphocytes Relative: 25 %
Lymphs Abs: 2.6 10*3/uL (ref 0.7–4.0)
MCH: 31.1 pg (ref 26.0–34.0)
MCHC: 34.6 g/dL (ref 30.0–36.0)
MCV: 90 fL (ref 80.0–100.0)
Monocytes Absolute: 1.1 10*3/uL — ABNORMAL HIGH (ref 0.1–1.0)
Monocytes Relative: 10 %
Neutro Abs: 6.6 10*3/uL (ref 1.7–7.7)
Neutrophils Relative %: 62 %
Platelets: 205 10*3/uL (ref 150–400)
RBC: 4.72 MIL/uL (ref 4.22–5.81)
RDW: 12 % (ref 11.5–15.5)
WBC: 10.5 10*3/uL (ref 4.0–10.5)
nRBC: 0 % (ref 0.0–0.2)

## 2018-01-08 LAB — COMPREHENSIVE METABOLIC PANEL
ALT: 32 U/L (ref 0–44)
AST: 24 U/L (ref 15–41)
Albumin: 4.1 g/dL (ref 3.5–5.0)
Alkaline Phosphatase: 72 U/L (ref 38–126)
Anion gap: 9 (ref 5–15)
BUN: 15 mg/dL (ref 6–20)
CO2: 24 mmol/L (ref 22–32)
Calcium: 8.7 mg/dL — ABNORMAL LOW (ref 8.9–10.3)
Chloride: 104 mmol/L (ref 98–111)
Creatinine, Ser: 1.24 mg/dL (ref 0.61–1.24)
GFR calc Af Amer: >60 mL/min (ref >60)
GFR calc non Af Amer: >60 mL/min (ref >60)
Glucose, Bld: 102 mg/dL — ABNORMAL HIGH (ref 70–99)
Potassium: 3.5 mmol/L (ref 3.5–5.1)
SODIUM: 137 mmol/L (ref 135–145)
Total Bilirubin: 1.1 mg/dL (ref 0.3–1.2)
Total Protein: 7.2 g/dL (ref 6.5–8.1)

## 2018-01-08 LAB — LIPASE, BLOOD: Lipase: 44 U/L (ref 11–51)

## 2018-01-08 IMAGING — CT CT RENAL STONE PROTOCOL
2 of 4 series · 16 of 46 positions shown, 18 images · non-contrast
Comparison: Radiograph [DATE], CT [DATE]

CLINICAL DATA: Right flank and back pain

EXAM:
CT ABDOMEN AND PELVIS WITHOUT CONTRAST
TECHNIQUE: Multidetector CT imaging of the abdomen and pelvis was performed
following the standard protocol without IV contrast.

[Series 2: stone full standard · axial · 0.83mm/px · z∈[-1073,-593]mm · 13 of 104 slices shown, 15 images]
[im 4/104  soft-tissue]
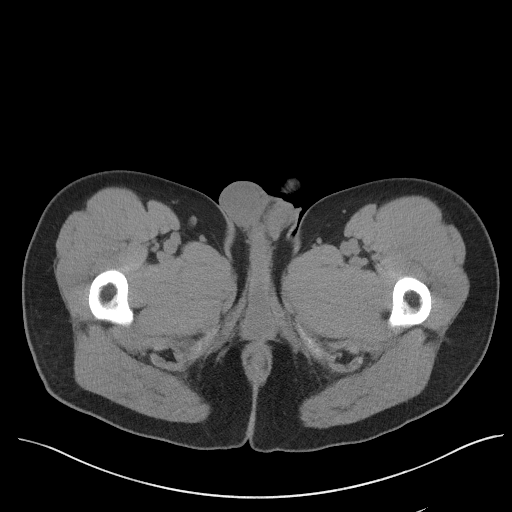
[im 4/104  bone]
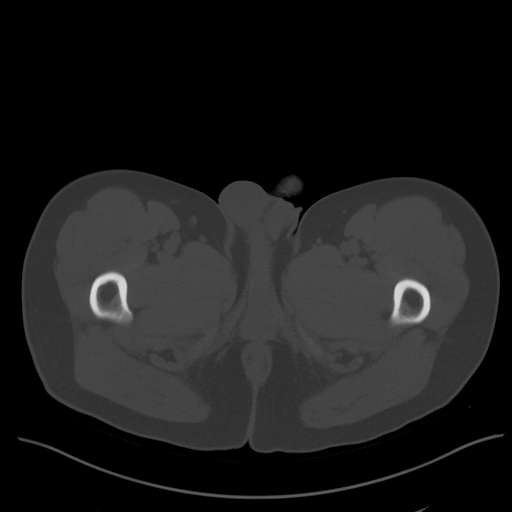
[im 12/104  soft-tissue]
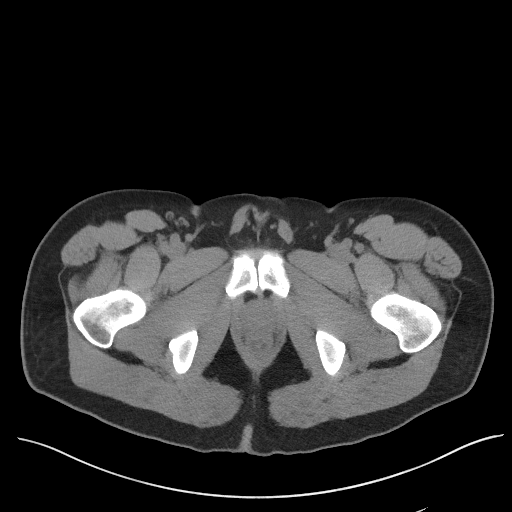
[im 20/104  soft-tissue]
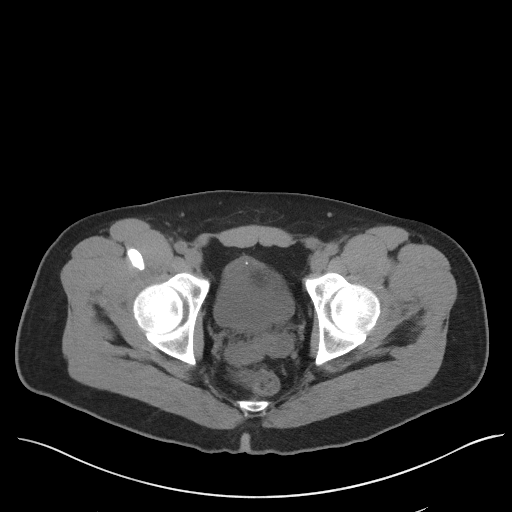
[im 28/104  soft-tissue]
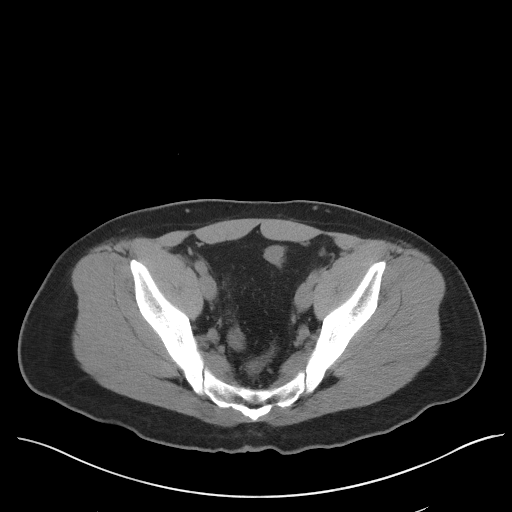
[im 36/104  soft-tissue]
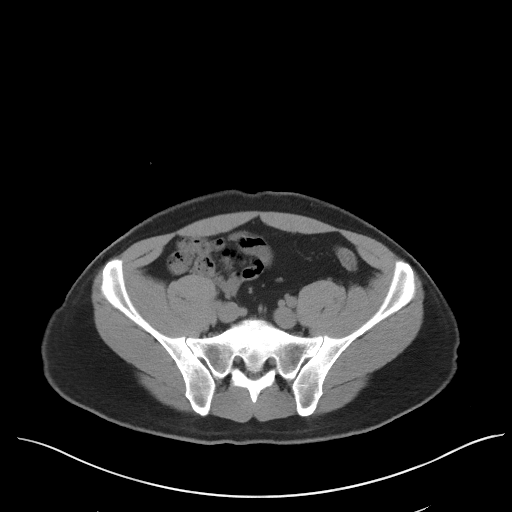
[im 44/104  soft-tissue]
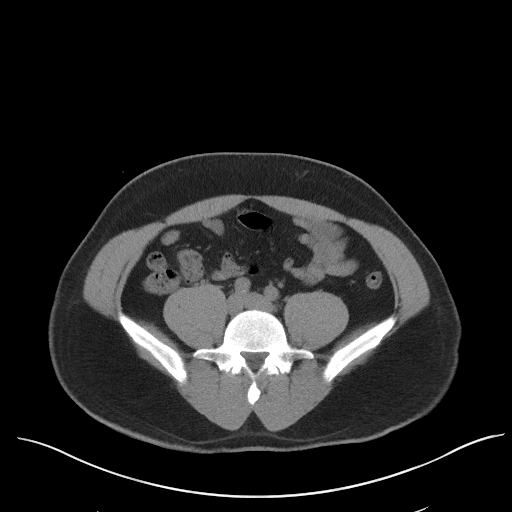
[im 52/104  soft-tissue]
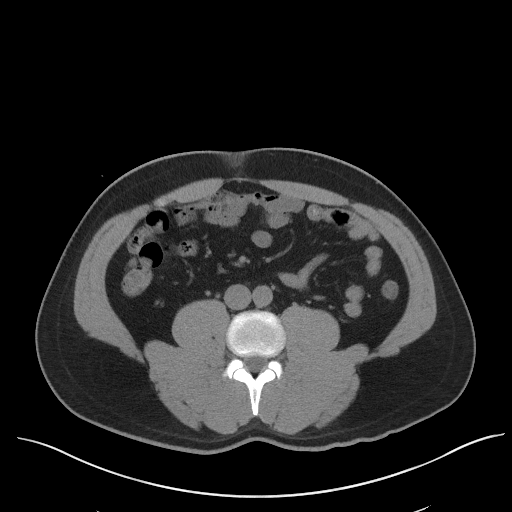
[im 60/104  soft-tissue]
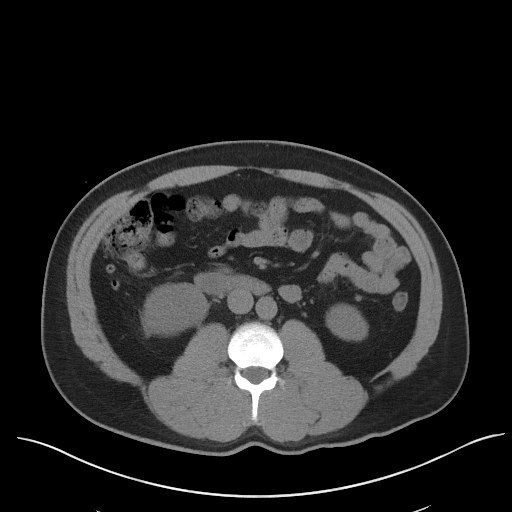
[im 68/104  soft-tissue]
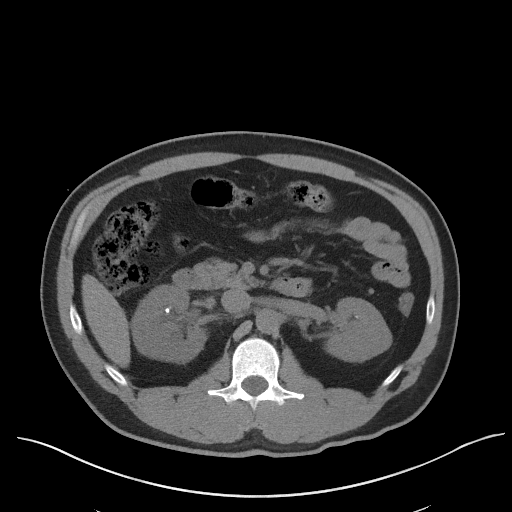
[im 68/104  bone]
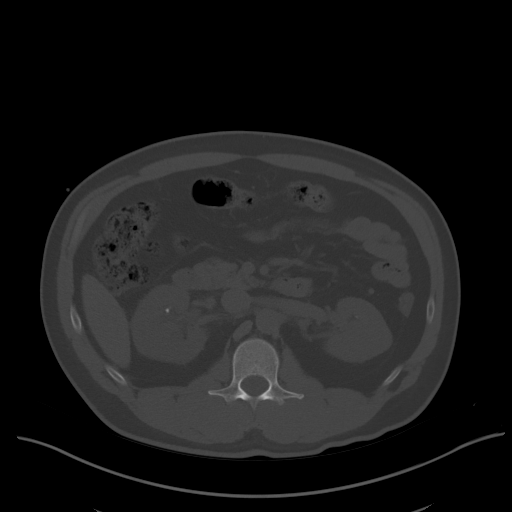
[im 76/104  soft-tissue]
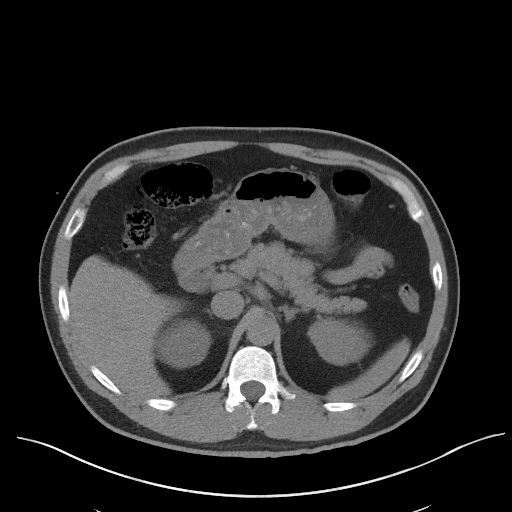
[im 84/104  soft-tissue]
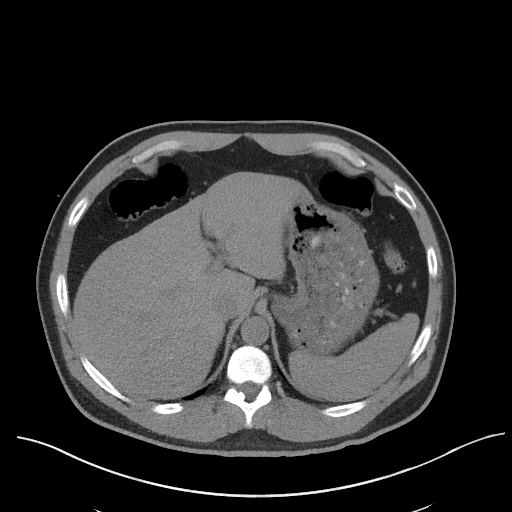
[im 92/104  soft-tissue]
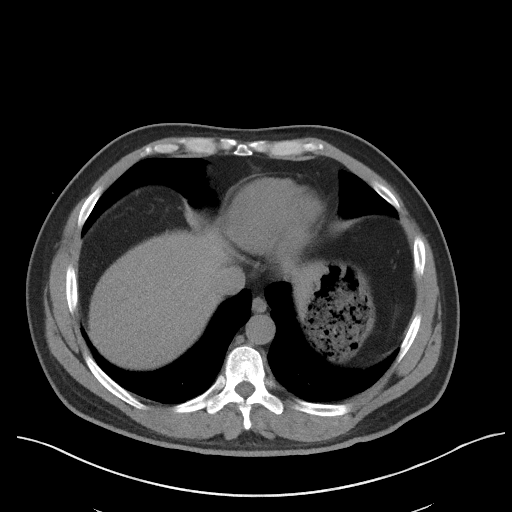
[im 100/104  soft-tissue]
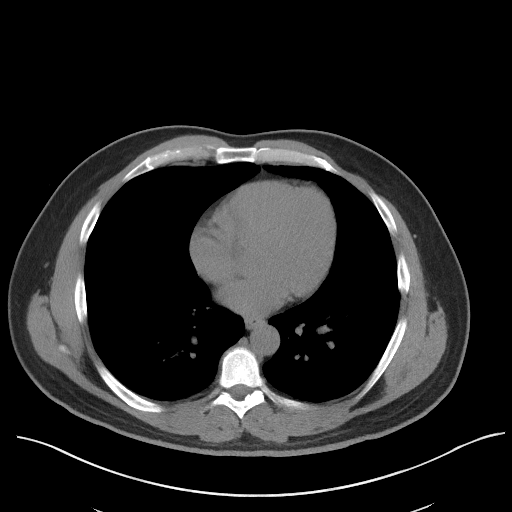

[Series 5: coronal · coronal · 0.80mm/px · 3 of 132 slices shown]
[im 44/132  soft-tissue]
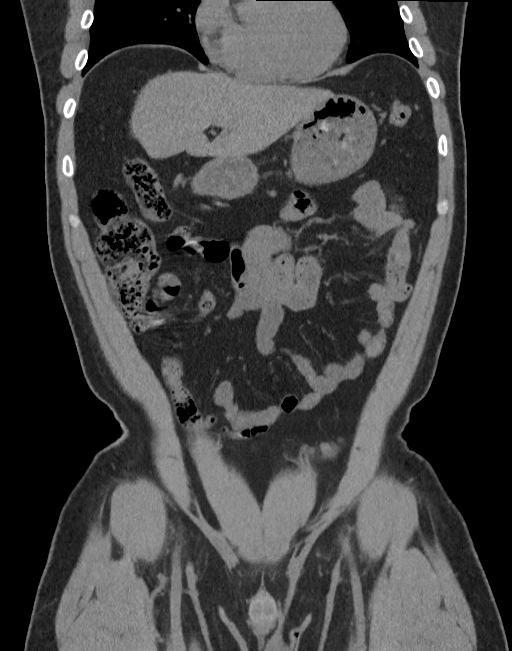
[im 59/132  soft-tissue]
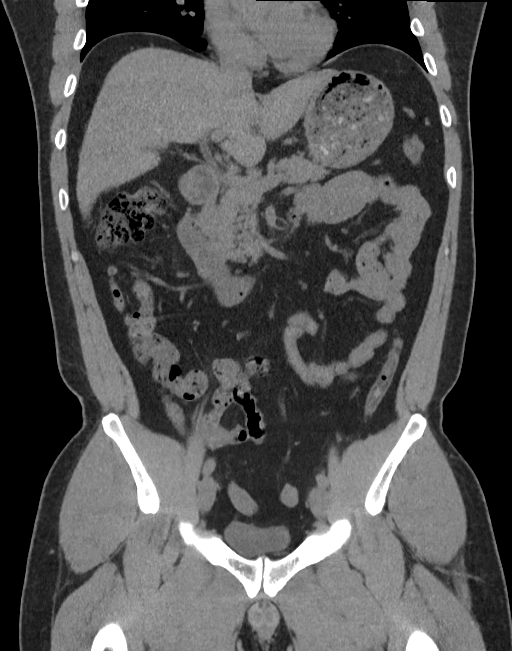
[im 73/132  soft-tissue]
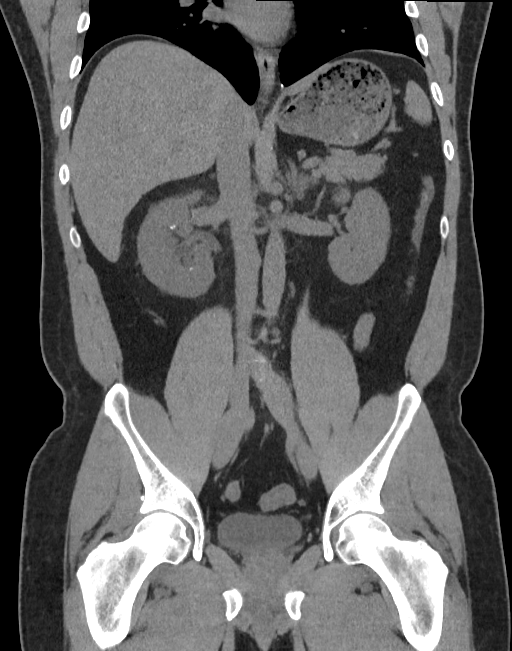

[16 of 46 positions shown; findings below may reference images not displayed]

FINDINGS: Lower chest: Lung bases demonstrate no acute consolidation or
effusion. Heart size is normal

Hepatobiliary: No focal liver abnormality is seen. No gallstones,
gallbladder wall thickening, or biliary dilatation. Contracted
gallbladder

Pancreas: Unremarkable. No pancreatic ductal dilatation or
surrounding inflammatory changes.

Spleen: Normal in size without focal abnormality.

Adrenals/Urinary Tract: Adrenal glands are normal. Multiple
intrarenal stones bilaterally. On the left largest stone is seen in
the lower pole and measures 6 mm. On the right largest stone is seen
midpole and measures 4 mm. Mild right hydronephrosis, secondary to a
6 x 7 mm stone in the proximal right ureter, just past the UPJ.
Bladder is unremarkable

Stomach/Bowel: Stomach is within normal limits. Appendix appears
normal. No evidence of bowel wall thickening, distention, or
inflammatory changes.

Vascular/Lymphatic: Mild aortic atherosclerosis. No aneurysm. No
significantly enlarged lymph nodes.

Reproductive: Prostate is unremarkable.

Other: Negative for free air or free fluid.

Musculoskeletal: No acute or significant osseous findings.
IMPRESSION: 1. Mild right hydronephrosis, secondary to a 6 x 7 mm stone in the
proximal right ureter just past the right UPJ.
2. Multiple intrarenal stones bilaterally

## 2018-01-08 MED ORDER — ONDANSETRON 4 MG PO TBDP: 4.0000 mg | ORAL_TABLET | Freq: Three times a day (TID) | ORAL | 0 refills | Status: DC | PRN

## 2018-01-08 MED ORDER — OXYCODONE-ACETAMINOPHEN 5-325 MG PO TABS: 1.0000 | ORAL_TABLET | Freq: Three times a day (TID) | ORAL | 0 refills | Status: DC | PRN

## 2018-01-08 MED ORDER — ONDANSETRON HCL 4 MG/2ML IJ SOLN
4.0000 mg | Freq: Once | INTRAMUSCULAR | Status: AC
  Administered 2018-01-08: 4 mg via INTRAVENOUS
  Filled 2018-01-08: qty 2

## 2018-01-08 MED ORDER — POLYETHYLENE GLYCOL 3350 17 GM/SCOOP PO POWD: 1.0000 | Freq: Once | ORAL | 0 refills | Status: AC

## 2018-01-08 MED ORDER — SODIUM CHLORIDE 0.9 % IV SOLN
Freq: Once | INTRAVENOUS | Status: AC
  Administered 2018-01-08: 23:00:00 via INTRAVENOUS

## 2018-01-08 MED ORDER — HYDROMORPHONE HCL 1 MG/ML IJ SOLN: 1.0000 mg | Freq: Once | INTRAMUSCULAR | Status: DC

## 2018-01-08 MED ORDER — TAMSULOSIN HCL 0.4 MG PO CAPS: 0.4000 mg | ORAL_CAPSULE | Freq: Every day | ORAL | 0 refills | Status: DC

## 2018-01-08 MED ORDER — KETOROLAC TROMETHAMINE 30 MG/ML IJ SOLN
30.0000 mg | Freq: Once | INTRAMUSCULAR | Status: AC
  Administered 2018-01-08: 30 mg via INTRAVENOUS
  Filled 2018-01-08: qty 1

## 2018-01-08 NOTE — ED Notes (Signed)
Patient transported to CT 

## 2018-01-08 NOTE — ED Triage Notes (Signed)
Pt to the er for pain on the right flank and back. Pt took PCN he had at home for possible infection. Pt is nauseated and in acute distress.

## 2018-01-08 NOTE — ED Provider Notes (Addendum)
[facility] Emergency Department Provider Note       Time seen: ----------------------------------------- 10:14 PM on 01/08/2018 -----------------------------------------   I have reviewed the triage vital signs and the nursing notes.  HISTORY   Chief Complaint Flank Pain    HPI Tyler Watson is a 47 y.o. male with a history of kidney stones who presents to the ED for right flank pain and right-sided back pain.  Patient took penicillin at home for possible infection.  Patient is nauseous and presents in acute distress from pain.  No past medical history on file.  There are no active problems to display for this patient.   Past Surgical History:  Procedure Laterality Date  . LITHOTRIPSY      Allergies Patient has no known allergies.  Social History Social History   Tobacco Use  . Smoking status: Never Smoker  . Smokeless tobacco: Never Used  Substance Use Topics  . Alcohol use: Not Currently  . Drug use: Never   Review of Systems Constitutional: Negative for fever. Cardiovascular: Negative for chest pain. Respiratory: Negative for shortness of breath. Gastrointestinal: Positive for flank pain, vomiting Musculoskeletal: Negative for back pain. Skin: Negative for rash. Neurological: Negative for headaches, focal weakness or numbness.  All systems negative/normal/unremarkable except as stated in the HPI  ____________________________________________   PHYSICAL EXAM:  VITAL SIGNS: ED Triage Vitals  Enc Vitals Group     BP      Pulse      Resp      Temp      Temp src      SpO2      Weight      Height      Head Circumference      Peak Flow      Pain Score      Pain Loc      Pain Edu?      Excl. in GC?    Constitutional: Alert and oriented.  Moderate distress from pain Cardiovascular: Normal rate, regular rhythm. No murmurs, rubs, or gallops. Respiratory: Normal respiratory effort without tachypnea nor retractions.  Breath sounds are clear and equal bilaterally. No wheezes/rales/rhonchi. Gastrointestinal: Right flank tenderness, no rebound or guarding.  Normal bowel sounds. Musculoskeletal: Nontender with normal range of motion in extremities. No lower extremity tenderness nor edema. Neurologic:  Normal speech and language. No gross focal neurologic deficits are appreciated.  Skin:  Skin is warm, dry and intact. No rash noted. Psychiatric: Mood and affect are normal. Speech and behavior are normal.   ____________________________________________  ED COURSE:  As part of my medical decision making, I reviewed the following data within the electronic MEDICAL RECORD NUMBER History obtained from family if available, nursing notes, old chart and ekg, as well as notes from prior ED visits. Patient presented for flank pain and likely renal colic, we will assess with labs and imaging as indicated at this time.   Procedures ____________________________________________   LABS (pertinent positives/negatives)  Labs Reviewed  CBC WITH DIFFERENTIAL/PLATELET - Abnormal; Notable for the following components:      Result Value   Monocytes Absolute 1.1 (*)    All other components within normal limits  COMPREHENSIVE METABOLIC PANEL  LIPASE, BLOOD  URINALYSIS, COMPLETE (UACMP) WITH MICROSCOPIC    RADIOLOGY Images were viewed by me  CT renal protocol Reveals proximal right ureteral stone ____________________________________________   DIFFERENTIAL DIAGNOSIS   Renal colic, UTI, pyelonephritis, muscle strain, radicular back pain  FINAL ASSESSMENT AND PLAN  Renal colic  Plan: The patient had presented for right flank pain. Patient's labs are still pending at this time. Patient's imaging did reveal a proximal right ureteral stone.  Patient be discharged with pain medicine, Flomax and also MiraLAX.  He has been somewhat constipated.   Ulice DashJohnathan E Williams, MD    Note: This note was generated in part or whole  with voice recognition software. Voice recognition is usually quite accurate but there are transcription errors that can and very often do occur. I apologize for any typographical errors that were not detected and corrected.     Emily FilbertWilliams, Jonathan E, MD 01/08/18 [id number]    Emily FilbertWilliams, Jonathan E, MD 01/08/18 [phone]

## 2018-01-09 MED ORDER — OXYCODONE-ACETAMINOPHEN 5-325 MG PO TABS
1.0000 | ORAL_TABLET | Freq: Once | ORAL | Status: AC
  Administered 2018-01-09: 1 via ORAL
  Filled 2018-01-09: qty 1

## 2018-01-09 NOTE — ED Notes (Signed)
Pt ambulatory to exit with steady gait accompanied by wife.

## 2018-01-09 NOTE — ED Notes (Addendum)
Pt verbalized understanding of d/c instructions, rx's and f/u care, pt report pain is starting to increase at this time, request additional pain medication prior to d/c due to limited pharmacy hours. MD aware. Awaiting order at this time.

## 2018-01-11 ENCOUNTER — Ambulatory Visit: Payer: BLUE CROSS/BLUE SHIELD | Admitting: Urology

## 2018-01-11 ENCOUNTER — Other Ambulatory Visit: Payer: Self-pay

## 2018-01-11 ENCOUNTER — Encounter: Payer: Self-pay | Admitting: Urology

## 2018-01-11 ENCOUNTER — Ambulatory Visit
Admission: RE | Admit: 2018-01-11 | Discharge: 2018-01-11 | Disposition: A | Discharge status: Home / Self Care | Payer: BLUE CROSS/BLUE SHIELD | Source: Ambulatory Visit | Attending: Urology | Admitting: Urology

## 2018-01-11 VITALS — BP 123/87 | HR 93 | Ht 67.0 in | Wt 189.0 lb

## 2018-01-11 DIAGNOSIS — N200 Calculus of kidney: Secondary | ICD-10-CM

## 2018-01-11 DIAGNOSIS — N201 Calculus of ureter: Secondary | ICD-10-CM

## 2018-01-11 IMAGING — CR DG ABDOMEN 1V
1 series · 2 of 2 positions shown · non-contrast
Comparison: CT [DATE].

CLINICAL DATA: Right-sided kidney stones.

EXAM:
ABDOMEN - 1 VIEW

[Series 1: dg abd 1 view · 0.14mm/px · 2 of 2 slices shown]
[im 1/2]
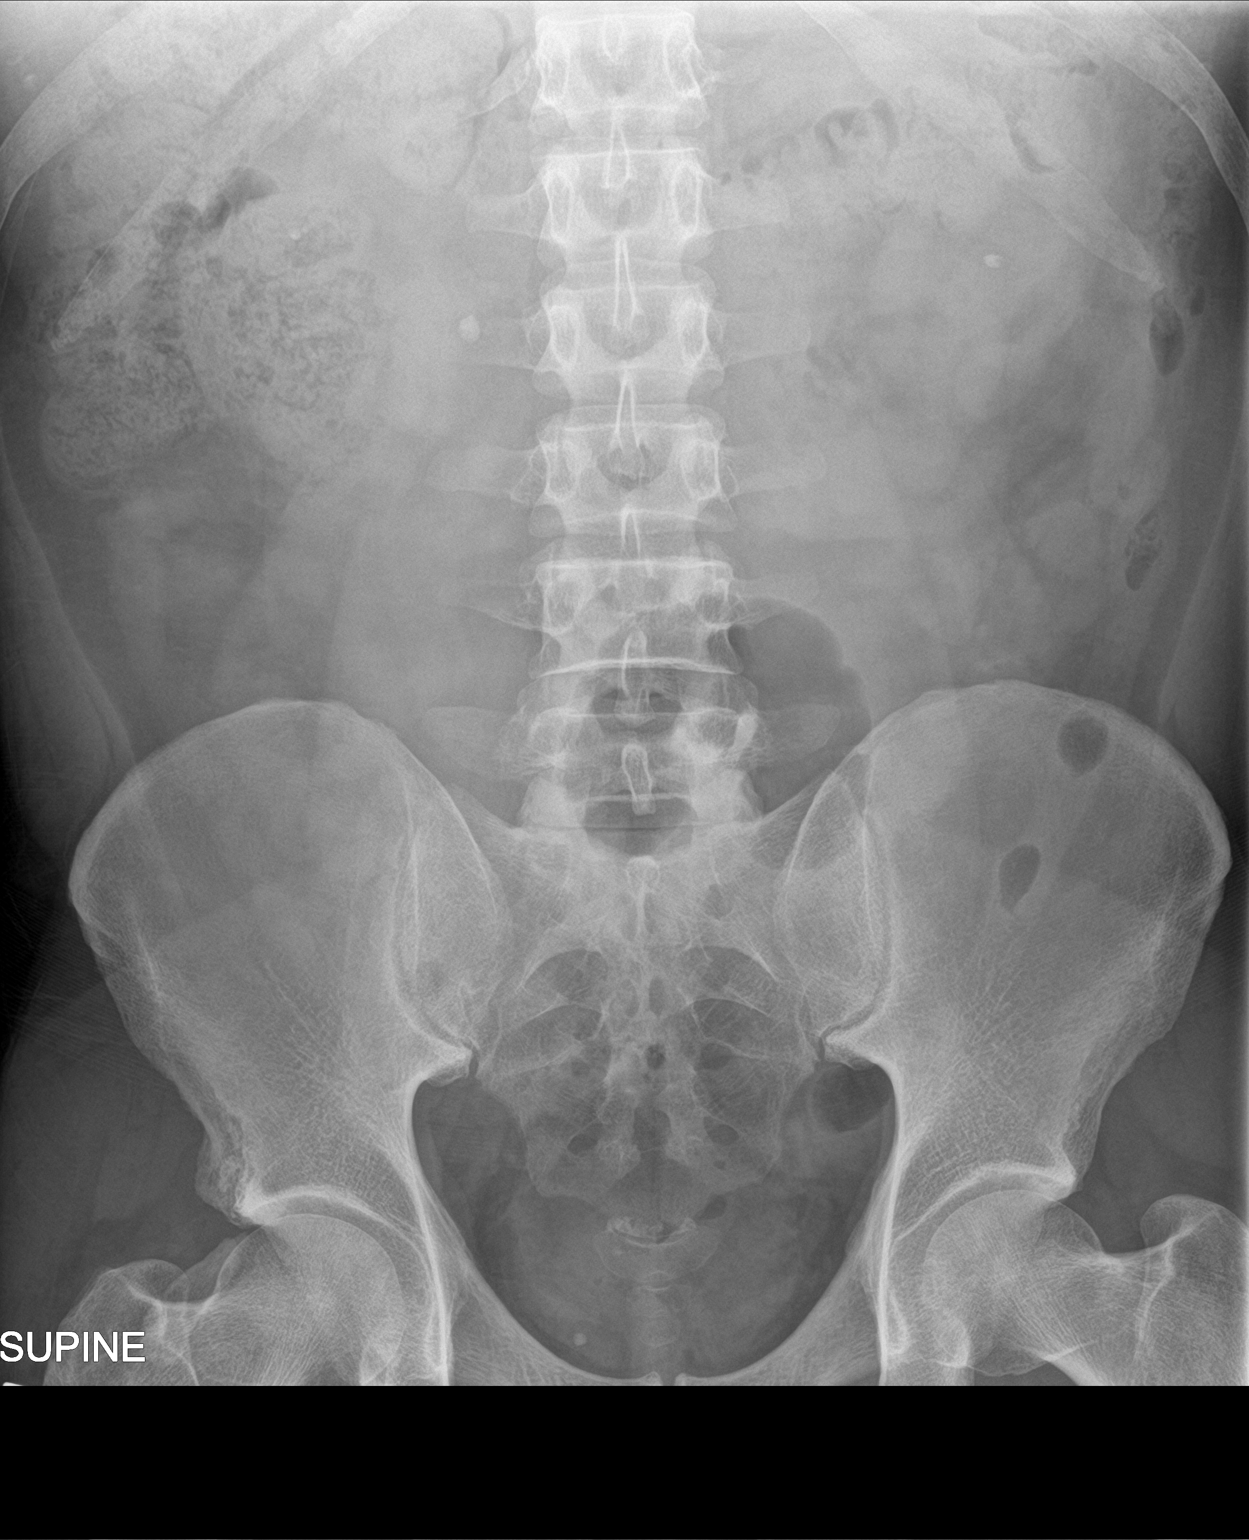
[im 2/2]
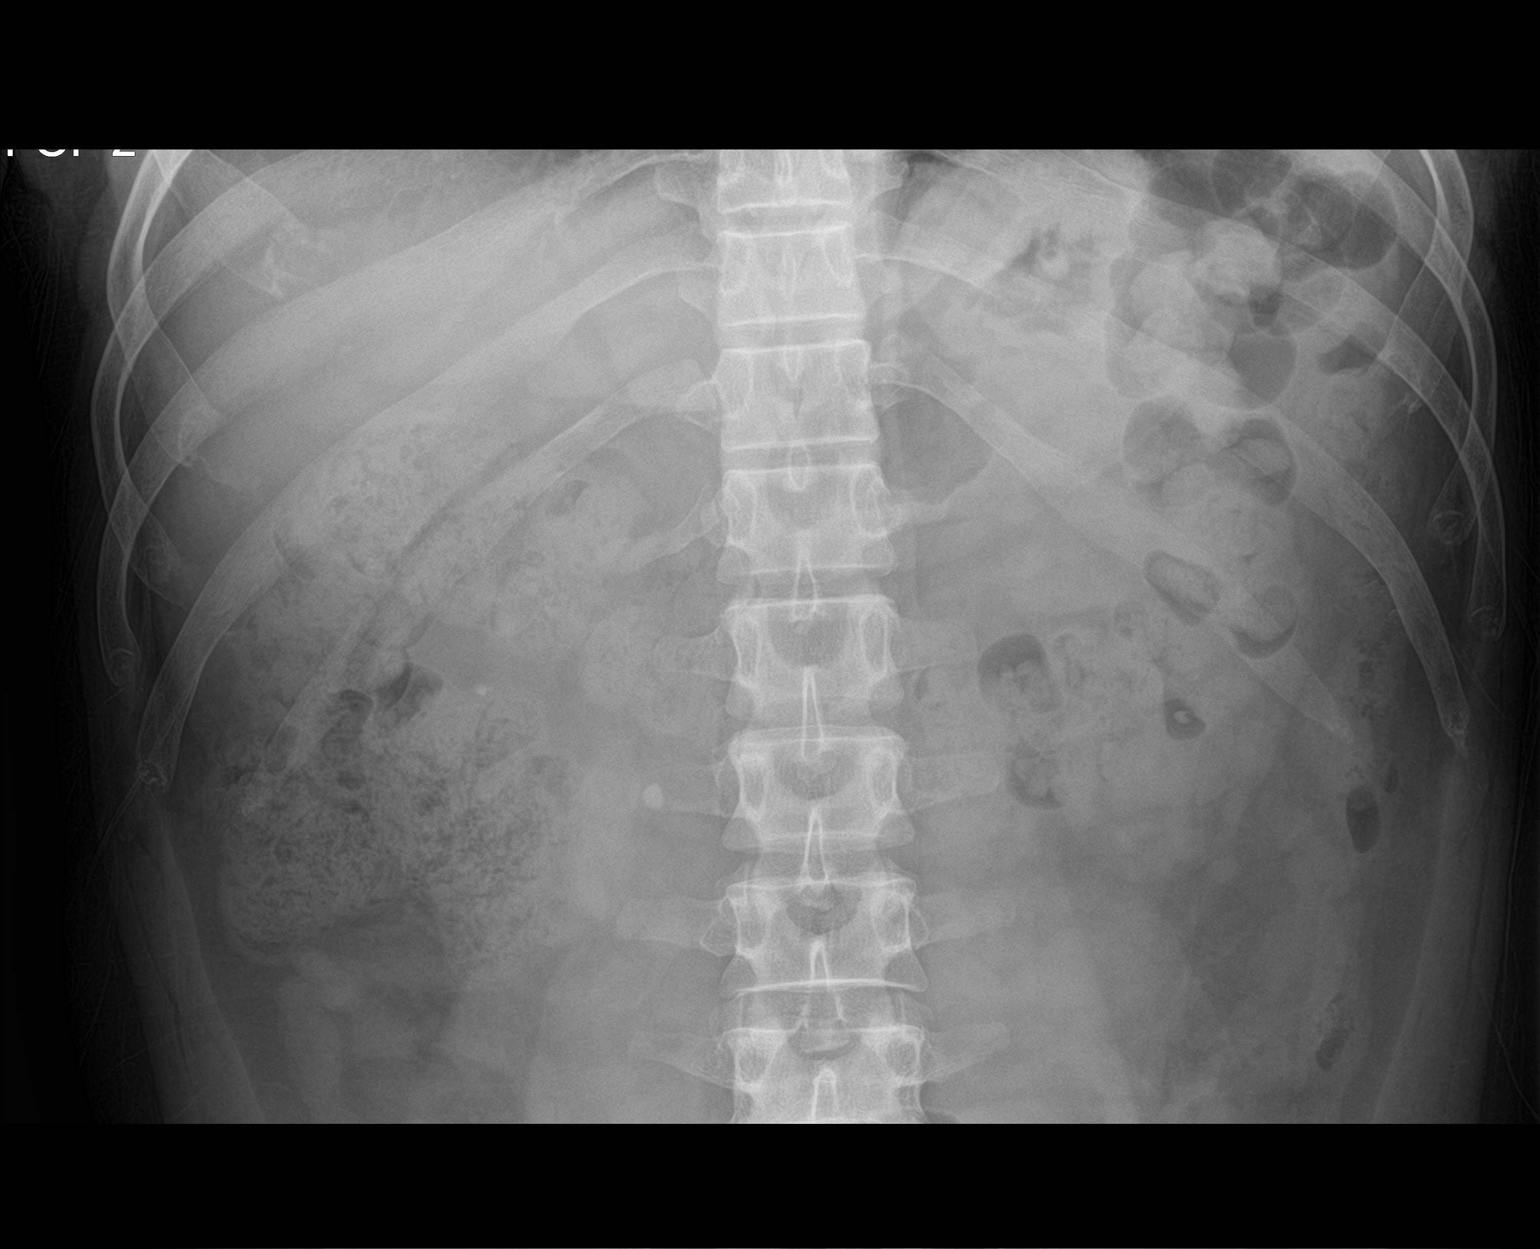

[2 of 2 positions shown; findings below may reference images not displayed]

FINDINGS: Soft tissue structures are unremarkable. 7 mm stone remains
positioned over the right upper ureter. Bilateral nephrolithiasis
stable. Calcific density in the pelvis possibly representing a small
bladder stone unchanged. No bowel distention. Stool noted throughout
the colon. No acute bony abnormality.
IMPRESSION: 1. 7 mm stone remains positioned over the right upper ureter. No
interim change from prior CT exam of [DATE].

2.  Stable bilateral nephrolithiasis.

## 2018-01-11 MED ORDER — OXYCODONE-ACETAMINOPHEN 5-325 MG PO TABS: 1.0000 | ORAL_TABLET | Freq: Four times a day (QID) | ORAL | 0 refills | Status: DC | PRN

## 2018-01-11 NOTE — Progress Notes (Signed)
   01/11/2018 3:30 PM   [name] 11/27/1970 [id number]  Reason for visit: Right 5mm UPJ stone  HPI: I saw Tyler Watson in urology clinic today for right-sided flank pain.  He is an otherwise healthy 48 year old male with a long history of recurrent nephrolithiasis.  He was seen in the emergency department on 12/31 with acute onset of sharp right-sided low back pain that radiated to the groin.  CT showed a right 7 mm UPJ stone with upstream hydronephrosis.  There were a few other scattered small less than 3 mm calculi in the right collecting system.  Urinalysis showed no infection and he was discharged with oral pain control and Flomax.  He has previously undergone ureteroscopy with Dr. Achilles Dunk in the past, with notable post-op pain.  He reports he did a 24-hour urine previously, however I am unable to find those results in the chart.  Ibuprofen and Percocet have improved his pain.  There are no aggravating factors.  Severity is moderate.  He denies chest pain or shortness of breath.   ROS: Please see flowsheet from today's date for complete review of systems.  Physical Exam: BP 123/87   Pulse 93   Ht 5\' 7"  (1.702 m)   Wt 189 lb (85.7 kg)   BMI 29.60 kg/m    Constitutional:  Alert and oriented, No acute distress. Cardiac:Regular rate and rhythm Respiratory: Clear to auscultation bilaterally GI: Abdomen is soft, nontender, nondistended, no abdominal masses GU: Right CVA tenderness Skin: No rashes, bruises or suspicious lesions. Neurologic: Grossly intact, no focal deficits, moving all 4 extremities. Psychiatric: Normal mood and affect  Laboratory Data: Reviewed Urinalysis 12/31 6-10 RBCs, 0-5 WBCs, no bacteria, nitrite negative  Pertinent Imaging:  I have personally reviewed the CT stone protocol dated 12/31 2019, and the KUB today.  7 mm right UPJ stone clearly visible on KUB, has not moved from prior CT. 975 HU, 12cm SSD.  Assessment & Plan:   In summary, Tyler Watson is a  healthy 48 year old male with recurrent nephrolithiasis, and current right-sided flank pain secondary to a 7 mm right UPJ stone.  We discussed various treatment options for urolithiasis including observation with or without medical expulsive therapy, shockwave lithotripsy (SWL), ureteroscopy and laser lithotripsy with stent placement, and percutaneous nephrolithotomy.  We discussed that management is based on stone size, location, density, patient co-morbidities, and patient preference.   Stones <42mm in size have a >80% spontaneous passage rate. Data surrounding the use of tamsulosin for medical expulsive therapy is controversial, but meta analyses suggests it is most efficacious for distal stones between 5-46mm in size. Possible side effects include dizziness/lightheadedness, and retrograde ejaculation.  SWL has a lower stone free rate in a single procedure, but also a lower complication rate compared to ureteroscopy and avoids a stent and associated stent related symptoms. Possible complications include renal hematoma, steinstrasse, and need for additional treatment.  Ureteroscopy with laser lithotripsy and stent placement has a higher stone free rate than SWL in a single procedure, however increased complication rate including possible infection, ureteral injury, bleeding, and stent related morbidity. Common stent related symptoms include dysuria, urgency/frequency, and flank pain.  After an extensive discussion of the risks and benefits of the above treatment options, the patient would like to proceed with right shockwave lithotripsy.  Needs metabolic evaluation after acute stone episode treated  Sondra Come, MD  [facility] [address] [place], [place] [address] [phone]

## 2018-01-11 NOTE — H&P (View-Only) (Signed)
   01/11/2018 3:30 PM   Tyler Watson 03/19/1970 [id number]  Reason for visit: Right 7mm UPJ stone  HPI: I saw Tyler Watson in urology clinic today for right-sided flank pain.  He is an otherwise healthy 47-year-old male with a long history of recurrent nephrolithiasis.  He was seen in the emergency department on 12/31 with acute onset of sharp right-sided low back pain that radiated to the groin.  CT showed a right 7 mm UPJ stone with upstream hydronephrosis.  There were a few other scattered small less than 3 mm calculi in the right collecting system.  Urinalysis showed no infection and he was discharged with oral pain control and Flomax.  He has previously undergone ureteroscopy with Dr. Cope in the past, with notable post-op pain.  He reports he did a 24-hour urine previously, however I am unable to find those results in the chart.  Ibuprofen and Percocet have improved his pain.  There are no aggravating factors.  Severity is moderate.  He denies chest pain or shortness of breath.   ROS: Please see flowsheet from today's date for complete review of systems.  Physical Exam: BP 123/87   Pulse 93   Ht 5' 7" (1.702 m)   Wt 189 lb (85.7 kg)   BMI 29.60 kg/m    Constitutional:  Alert and oriented, No acute distress. Cardiac:Regular rate and rhythm Respiratory: Clear to auscultation bilaterally GI: Abdomen is soft, nontender, nondistended, no abdominal masses GU: Right CVA tenderness Skin: No rashes, bruises or suspicious lesions. Neurologic: Grossly intact, no focal deficits, moving all 4 extremities. Psychiatric: Normal mood and affect  Laboratory Data: Reviewed Urinalysis 12/31 6-10 RBCs, 0-5 WBCs, no bacteria, nitrite negative  Pertinent Imaging:  I have personally reviewed the CT stone protocol dated 12/31 2019, and the KUB today.  7 mm right UPJ stone clearly visible on KUB, has not moved from prior CT. 975 HU, 12cm SSD.  Assessment & Plan:   In summary, Tyler Watson is a  healthy 47-year-old male with recurrent nephrolithiasis, and current right-sided flank pain secondary to a 7 mm right UPJ stone.  We discussed various treatment options for urolithiasis including observation with or without medical expulsive therapy, shockwave lithotripsy (SWL), ureteroscopy and laser lithotripsy with stent placement, and percutaneous nephrolithotomy.  We discussed that management is based on stone size, location, density, patient co-morbidities, and patient preference.   Stones <5mm in size have a >80% spontaneous passage rate. Data surrounding the use of tamsulosin for medical expulsive therapy is controversial, but meta analyses suggests it is most efficacious for distal stones between 5-10mm in size. Possible side effects include dizziness/lightheadedness, and retrograde ejaculation.  SWL has a lower stone free rate in a single procedure, but also a lower complication rate compared to ureteroscopy and avoids a stent and associated stent related symptoms. Possible complications include renal hematoma, steinstrasse, and need for additional treatment.  Ureteroscopy with laser lithotripsy and stent placement has a higher stone free rate than SWL in a single procedure, however increased complication rate including possible infection, ureteral injury, bleeding, and stent related morbidity. Common stent related symptoms include dysuria, urgency/frequency, and flank pain.  After an extensive discussion of the risks and benefits of the above treatment options, the patient would like to proceed with right shockwave lithotripsy.  Needs metabolic evaluation after acute stone episode treated  Hanny Elsberry C Domini Vandehei, MD  [facility] [address], [address] [place], [place] [address] [phone]    

## 2018-01-14 ENCOUNTER — Other Ambulatory Visit: Payer: Self-pay | Admitting: Radiology

## 2018-01-14 ENCOUNTER — Other Ambulatory Visit: Payer: BLUE CROSS/BLUE SHIELD

## 2018-01-14 DIAGNOSIS — N201 Calculus of ureter: Secondary | ICD-10-CM

## 2018-01-14 LAB — URINALYSIS, COMPLETE
Bilirubin, UA: NEGATIVE
Glucose, UA: NEGATIVE
Ketones, UA: NEGATIVE
LEUKOCYTES UA: NEGATIVE
Nitrite, UA: NEGATIVE
PROTEIN UA: NEGATIVE
RBC, UA: NEGATIVE
Specific Gravity, UA: 1.015 (ref 1.005–1.030)
Urobilinogen, Ur: 0.2 mg/dL (ref 0.2–1.0)
pH, UA: 6 (ref 5.0–7.5)

## 2018-01-16 MED ORDER — CIPROFLOXACIN HCL 500 MG PO TABS
500.0000 mg | ORAL_TABLET | ORAL | Status: AC
  Administered 2018-01-17: 500 mg via ORAL

## 2018-01-17 ENCOUNTER — Other Ambulatory Visit: Payer: Self-pay

## 2018-01-17 ENCOUNTER — Encounter: Payer: Self-pay | Admitting: Urology

## 2018-01-17 ENCOUNTER — Ambulatory Visit: Payer: BLUE CROSS/BLUE SHIELD

## 2018-01-17 ENCOUNTER — Encounter
Admission: RE | Disposition: A | Discharge status: Home / Self Care | Payer: Self-pay | Source: Ambulatory Visit | Attending: Urology

## 2018-01-17 ENCOUNTER — Ambulatory Visit
Admission: RE | Admit: 2018-01-17 | Discharge: 2018-01-17 | Disposition: A | Discharge status: Home / Self Care | Payer: BLUE CROSS/BLUE SHIELD | Source: Ambulatory Visit | Attending: Urology | Admitting: Urology

## 2018-01-17 DIAGNOSIS — N132 Hydronephrosis with renal and ureteral calculous obstruction: Secondary | ICD-10-CM | POA: Insufficient documentation

## 2018-01-17 DIAGNOSIS — Z87442 Personal history of urinary calculi: Secondary | ICD-10-CM | POA: Diagnosis not present

## 2018-01-17 DIAGNOSIS — N201 Calculus of ureter: Secondary | ICD-10-CM

## 2018-01-17 HISTORY — PX: EXTRACORPOREAL SHOCK WAVE LITHOTRIPSY: SHX1557

## 2018-01-17 LAB — CULTURE, URINE COMPREHENSIVE

## 2018-01-17 IMAGING — CR DG ABDOMEN 1V
1 series · 2 of 2 positions shown · non-contrast
Comparison: [DATE].

CLINICAL DATA: Right ureteral stone.  Lithotripsy gland.

EXAM:
ABDOMEN - 1 VIEW

[Series 1: dg abd 1 view · 0.14mm/px · 2 of 2 slices shown]
[im 1/2]
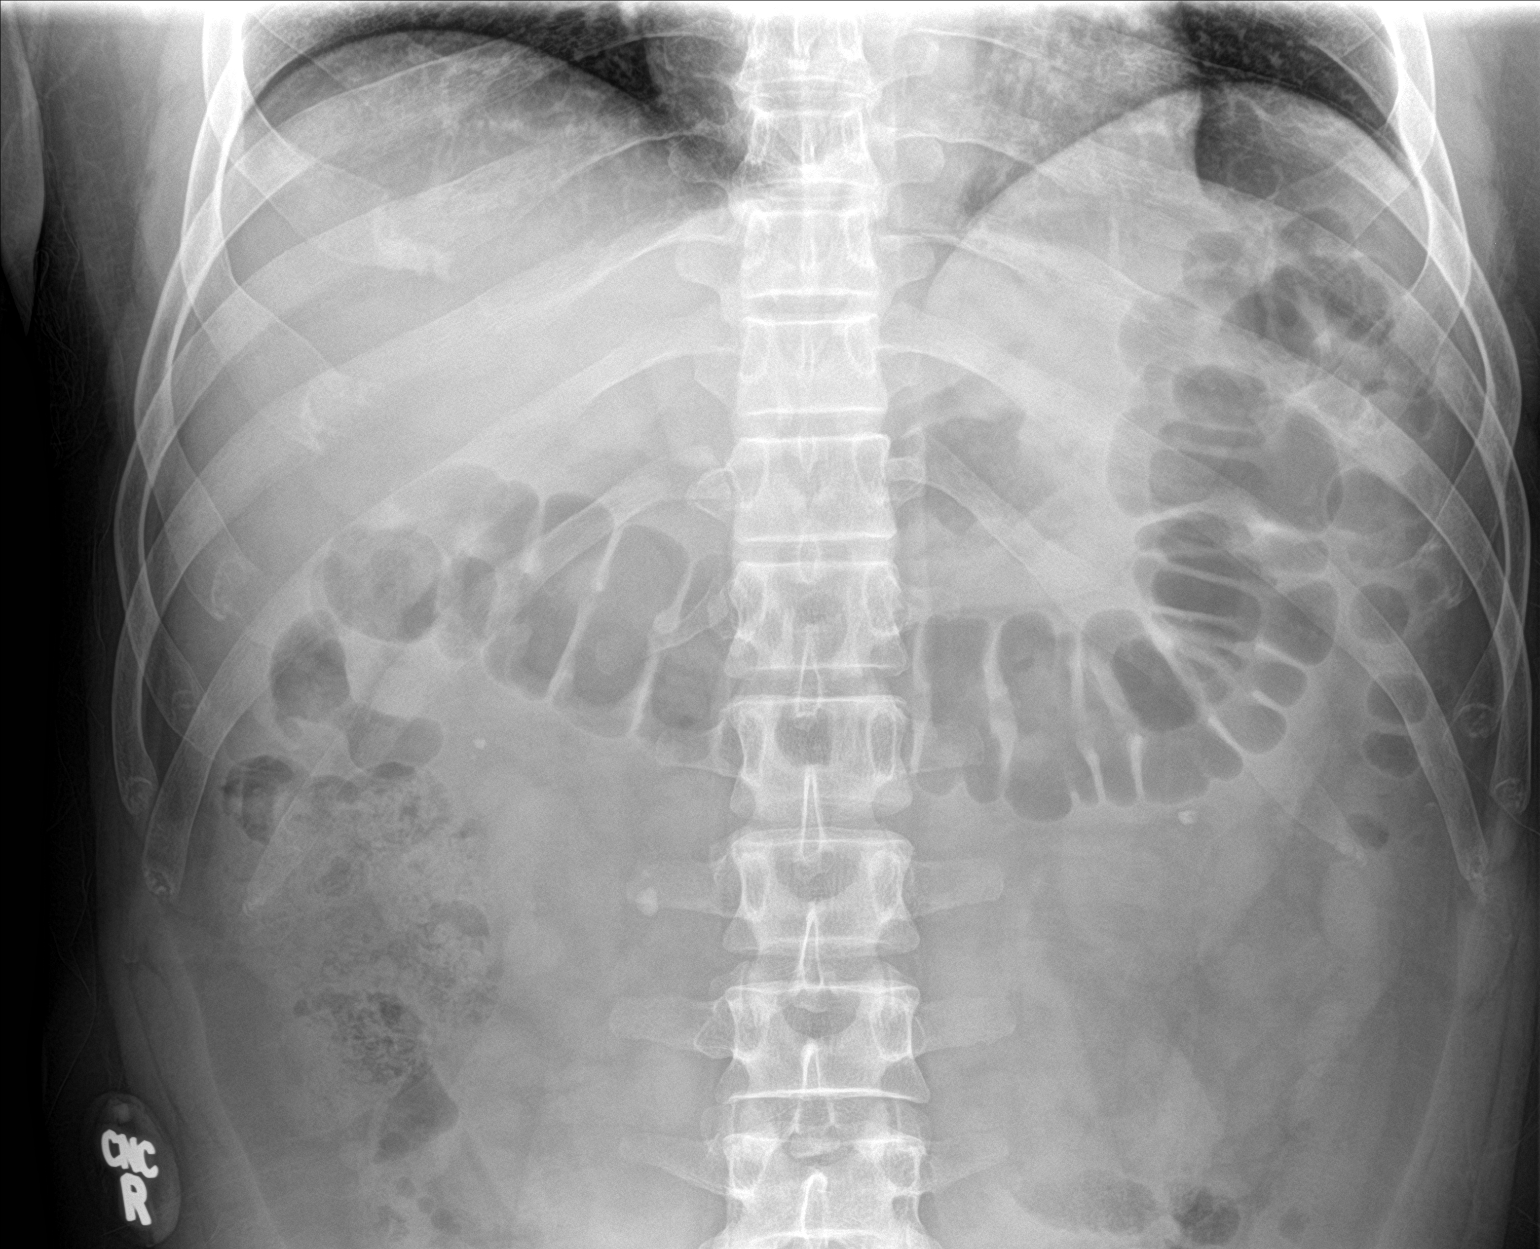
[im 2/2]
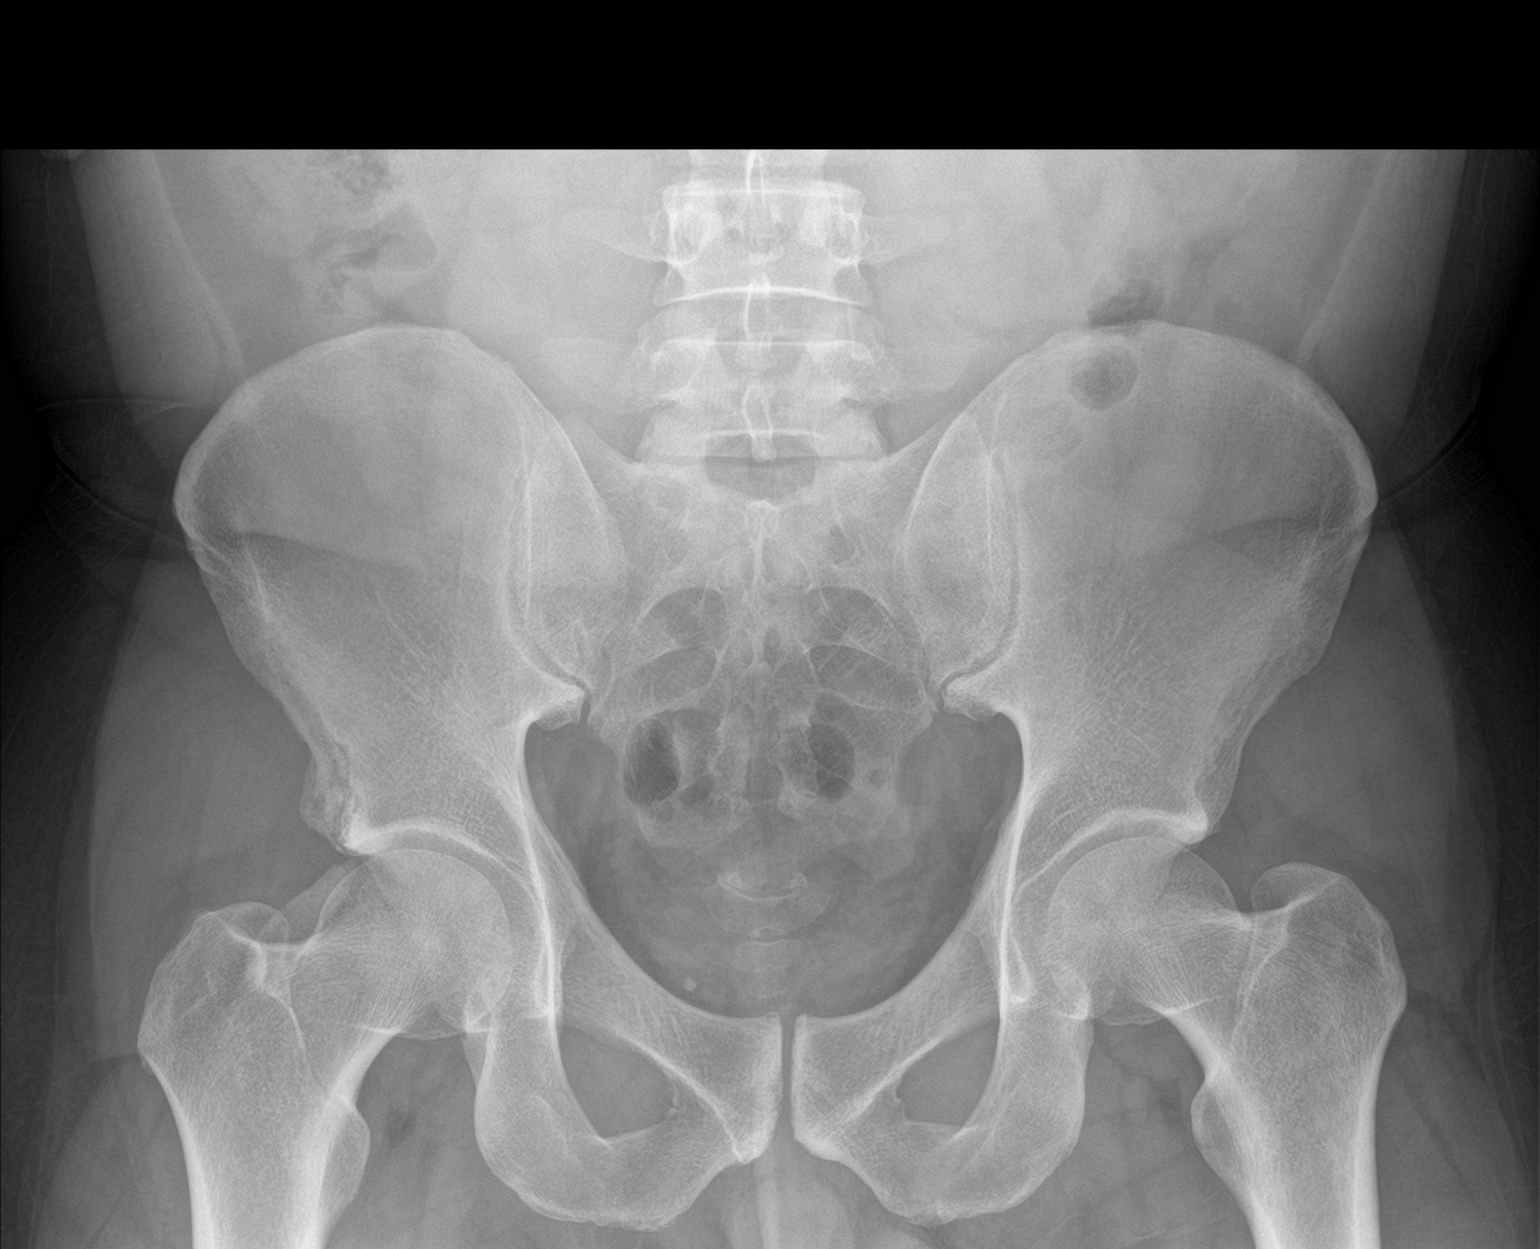

[2 of 2 positions shown; findings below may reference images not displayed]

FINDINGS: Stone noted over the right upper ureter again noted without interim
change. Bilateral nephrolithiasis again noted. No interim change in
appearance from prior exam. Stable pelvic calcifications consistent
phleboliths. No bowel distention. No acute bony abnormality.
IMPRESSION: 1. Stone noted over the right upper ureter, unchanged in position
from prior exam.

2.  Bilateral nephrolithiasis unchanged.

## 2018-01-17 SURGERY — LITHOTRIPSY, ESWL
Anesthesia: Moderate Sedation | Laterality: Right

## 2018-01-17 MED ORDER — DIPHENHYDRAMINE HCL 25 MG PO CAPS
ORAL_CAPSULE | ORAL | Status: AC
  Administered 2018-01-17: 25 mg via ORAL
  Filled 2018-01-17: qty 1

## 2018-01-17 MED ORDER — ONDANSETRON HCL 4 MG/2ML IJ SOLN
4.0000 mg | Freq: Once | INTRAMUSCULAR | Status: AC
  Administered 2018-01-17: 4 mg via INTRAVENOUS

## 2018-01-17 MED ORDER — DIAZEPAM 5 MG PO TABS
10.0000 mg | ORAL_TABLET | ORAL | Status: AC
  Administered 2018-01-17: 10 mg via ORAL

## 2018-01-17 MED ORDER — ONDANSETRON HCL 2 MG/ML IV SOLN
4.0000 mg | Freq: Once | INTRAVENOUS | Status: DC
  Administered 2018-01-17: 4 mg via INTRAVENOUS

## 2018-01-17 MED ORDER — OXYCODONE-ACETAMINOPHEN 5-325 MG PO TABS: 1.0000 | ORAL_TABLET | Freq: Three times a day (TID) | ORAL | 0 refills | Status: DC | PRN

## 2018-01-17 MED ORDER — DIAZEPAM 5 MG PO TABS
ORAL_TABLET | ORAL | Status: AC
  Administered 2018-01-17: 10 mg via ORAL
  Filled 2018-01-17: qty 2

## 2018-01-17 MED ORDER — DIPHENHYDRAMINE HCL 25 MG PO CAPS
25.0000 mg | ORAL_CAPSULE | ORAL | Status: AC
  Administered 2018-01-17: 25 mg via ORAL

## 2018-01-17 MED ORDER — CIPROFLOXACIN HCL 500 MG PO TABS
ORAL_TABLET | ORAL | Status: AC
  Administered 2018-01-17: 500 mg via ORAL
  Filled 2018-01-17: qty 1

## 2018-01-17 MED ORDER — SODIUM CHLORIDE 0.9 % IV SOLN
INTRAVENOUS | Status: DC
  Administered 2018-01-17: 07:00:00 via INTRAVENOUS

## 2018-01-17 MED ORDER — ONDANSETRON HCL 4 MG/2ML IJ SOLN
INTRAMUSCULAR | Status: AC
  Administered 2018-01-17: 4 mg via INTRAVENOUS
  Filled 2018-01-17: qty 2

## 2018-01-17 NOTE — Discharge Instructions (Addendum)
See [facility] discharge instructions in chart.  AMBULATORY SURGERY  DISCHARGE INSTRUCTIONS   1) The drugs that you were given will stay in your system until tomorrow so for the next 24 hours you should not:  A) Drive an automobile B) Make any legal decisions C) Drink any alcoholic beverage   2) You may resume regular meals tomorrow.  Today it is better to start with liquids and gradually work up to solid foods.  You may eat anything you prefer, but it is better to start with liquids, then soup and crackers, and gradually work up to solid foods.   3) Please notify your doctor immediately if you have any unusual bleeding, trouble breathing, redness and pain at the surgery site, drainage, fever, or pain not relieved by medication.    4) Additional Instructions:        Please contact your physician with any problems or Same Day Surgery at [phone], Monday through Friday 6 am to 4 pm, or [facility] Main number at [phone].  

## 2018-01-17 NOTE — Interval H&P Note (Signed)
History and Physical Interval Note:  01/17/2018 8:28 AM  Tyler Watson  has presented today for surgery, with the diagnosis of Kidney stone  The various methods of treatment have been discussed with the patient and family. After consideration of risks, benefits and other options for treatment, the patient has consented to  Procedure(s): EXTRACORPOREAL SHOCK WAVE LITHOTRIPSY (ESWL) (Right) as a surgical intervention .  The patient's history has been reviewed, patient examined, no change in status, stable for surgery.  I have reviewed the patient's chart and labs.  Questions were answered to the patient's satisfaction.     Vanna Scotland

## 2018-01-18 ENCOUNTER — Encounter: Payer: Self-pay | Admitting: Urology

## 2018-02-05 ENCOUNTER — Ambulatory Visit
Admission: RE | Admit: 2018-02-05 | Discharge: 2018-02-05 | Disposition: A | Discharge status: Home / Self Care | Payer: BLUE CROSS/BLUE SHIELD | Source: Ambulatory Visit | Attending: Urology | Admitting: Urology

## 2018-02-05 ENCOUNTER — Encounter: Payer: Self-pay | Admitting: Urology

## 2018-02-05 ENCOUNTER — Ambulatory Visit (INDEPENDENT_AMBULATORY_CARE_PROVIDER_SITE_OTHER): Payer: BLUE CROSS/BLUE SHIELD | Admitting: Urology

## 2018-02-05 VITALS — BP 100/69 | HR 93 | Ht 67.0 in | Wt 189.0 lb

## 2018-02-05 DIAGNOSIS — N201 Calculus of ureter: Secondary | ICD-10-CM | POA: Insufficient documentation

## 2018-02-05 DIAGNOSIS — N200 Calculus of kidney: Secondary | ICD-10-CM

## 2018-02-05 IMAGING — CR DG ABDOMEN 1V
2 series · 2 of 2 positions shown · non-contrast
Comparison: [DATE]

CLINICAL DATA: Follow-up right ureteral stone

EXAM:
ABDOMEN - 1 VIEW

[abdomen kub (1 of 2)]
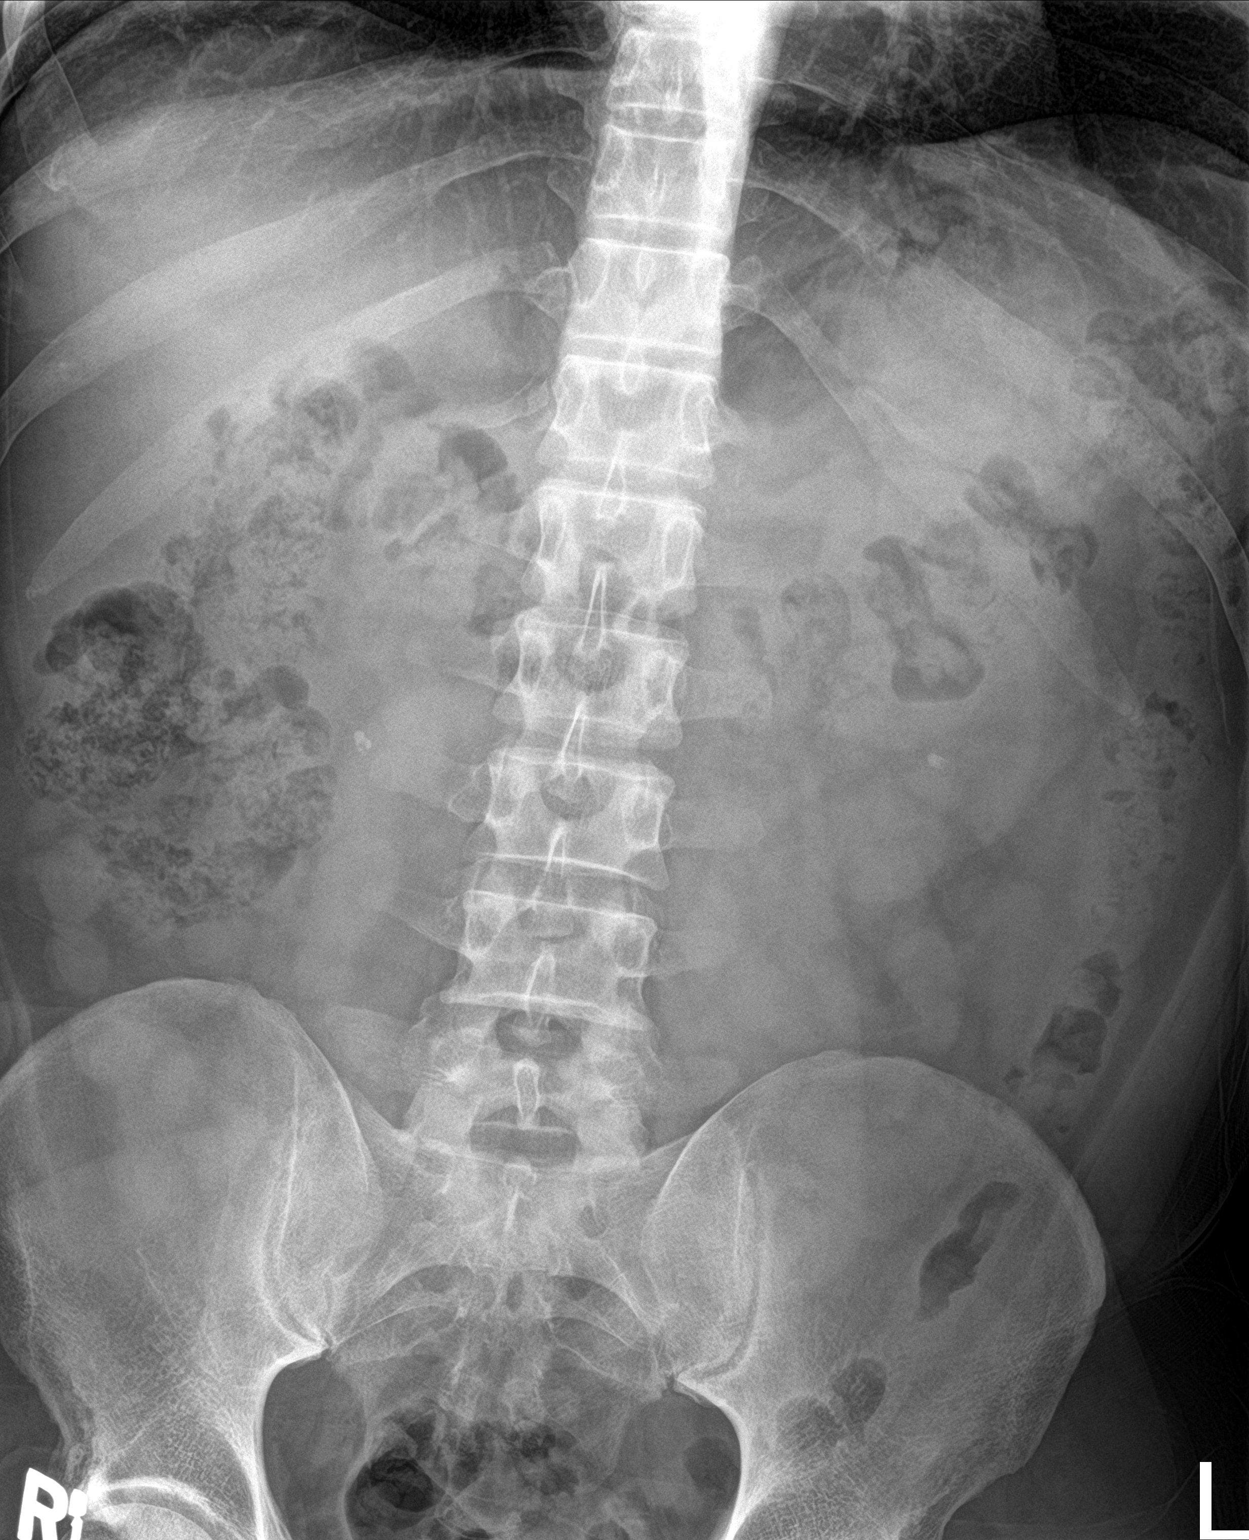

[abdomen kub (2 of 2)]
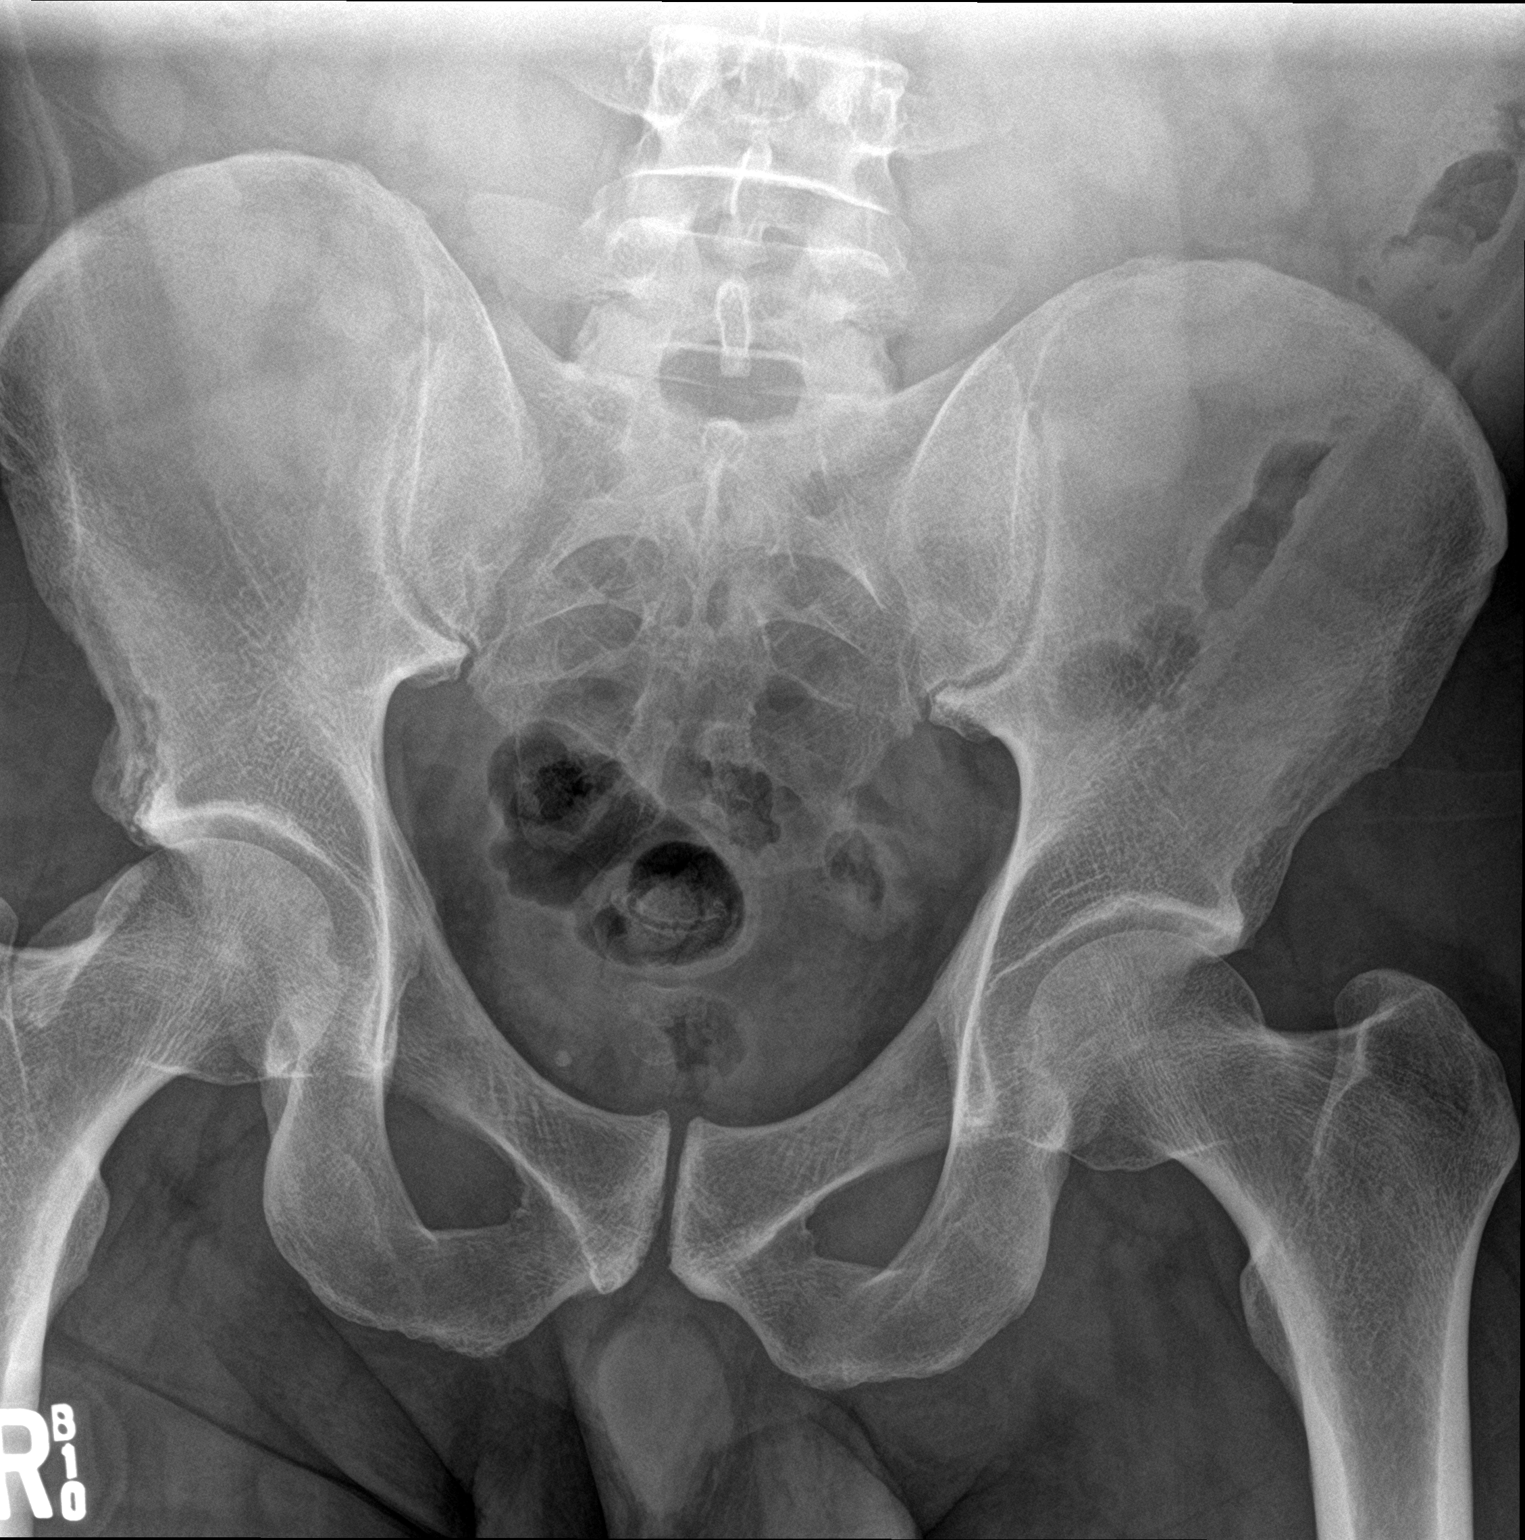

[2 of 2 positions shown; findings below may reference images not displayed]

FINDINGS: Scattered large and small bowel gas is noted. Lower pole left renal
stone is again identified and stable. Proximal right ureteral stone
is noted but has a somewhat smaller appearance and suggestion of
fragmentation consistent with recent lithotripsy. No definitive
distal ureteral stones are noted.
IMPRESSION: Stable location of right ureteral stone although the appearance is
somewhat fragmented related to the recent lithotripsy.

Stable left lower pole renal stone.

## 2018-02-06 ENCOUNTER — Other Ambulatory Visit: Payer: Self-pay | Admitting: Urology

## 2018-02-06 NOTE — Progress Notes (Addendum)
   02/05/2018 1:28 PM   Tyler Watson March 07, 1970 [id number]  Reason for visit: Follow up R SWL  HPI: I saw Tyler Watson in urology clinic today in follow-up after right SWL on 01/17/2018 for a 7 mm UPJ stone with my partner Dr. Vanna Scotland.  He notes complete resolution of his right-sided pain since the procedure.  He did have some dizziness while taking Flomax in the morning.  He passed a number of large stone fragments which he brings today to clinic.  He has a history of recurrent urolithiasis and has previously undergone ureteroscopy as well.  He reports he completed a 24-hour urine previously, however I do not see these results available in the chart or care everywhere.  He denies flank pain, fevers, or chills.  I personally reviewed his KUB today, there are small residual fragments in the proximal ureter.  RTC 4 weeks for repeat KUB to confirm clearance of fragments Continue Flomax Stone fragments sent for analysis We will try to obtain prior 24-hour urine results, if unavailable, consider repeating metabolic evaluation  ADDENDUM: 24 hour urine from 04/19/2017 scanned into Media tab Risk factors: Low urine volume 1.88L Very low urine citrate 197 Elevated urine oxalate Urine pH 5.5  Will recommend minimize high oxalate foods, add calcium to high oxalate meals, increase urine volume, and likely start Potassium citrate BID. Plan to repeat 24 hour urine after 3-6 months of above changes.  A total of 15 minutes were spent face-to-face with the patient, greater than 50% was spent in patient education, counseling, and coordination of care regarding nephrolithiasis, KUB results, and stone prevention.  Sondra Come, MD  [facility] [address] [place], [place] [address] [phone]

## 2018-02-08 ENCOUNTER — Other Ambulatory Visit: Payer: Self-pay | Admitting: Urology

## 2018-03-11 ENCOUNTER — Ambulatory Visit
Admission: RE | Admit: 2018-03-11 | Discharge: 2018-03-11 | Disposition: A | Discharge status: Home / Self Care | Payer: BLUE CROSS/BLUE SHIELD | Source: Ambulatory Visit | Attending: Urology | Admitting: Urology

## 2018-03-11 ENCOUNTER — Encounter: Payer: Self-pay | Admitting: Urology

## 2018-03-11 ENCOUNTER — Ambulatory Visit: Payer: BLUE CROSS/BLUE SHIELD | Admitting: Urology

## 2018-03-11 VITALS — BP 121/79 | HR 95 | Ht 67.0 in | Wt 188.0 lb

## 2018-03-11 DIAGNOSIS — N200 Calculus of kidney: Secondary | ICD-10-CM

## 2018-03-11 IMAGING — CR DG ABDOMEN 1V
1 series · 2 of 2 positions shown · non-contrast
Comparison: Multiple KUBs since [DATE].

CLINICAL DATA: Kidney stone surgery in [REDACTED]. Evaluate for renal
stones.

EXAM:
ABDOMEN - 1 VIEW

[Series 1: dg abd 1 view · 0.14mm/px · 2 of 2 slices shown]
[im 1/2]
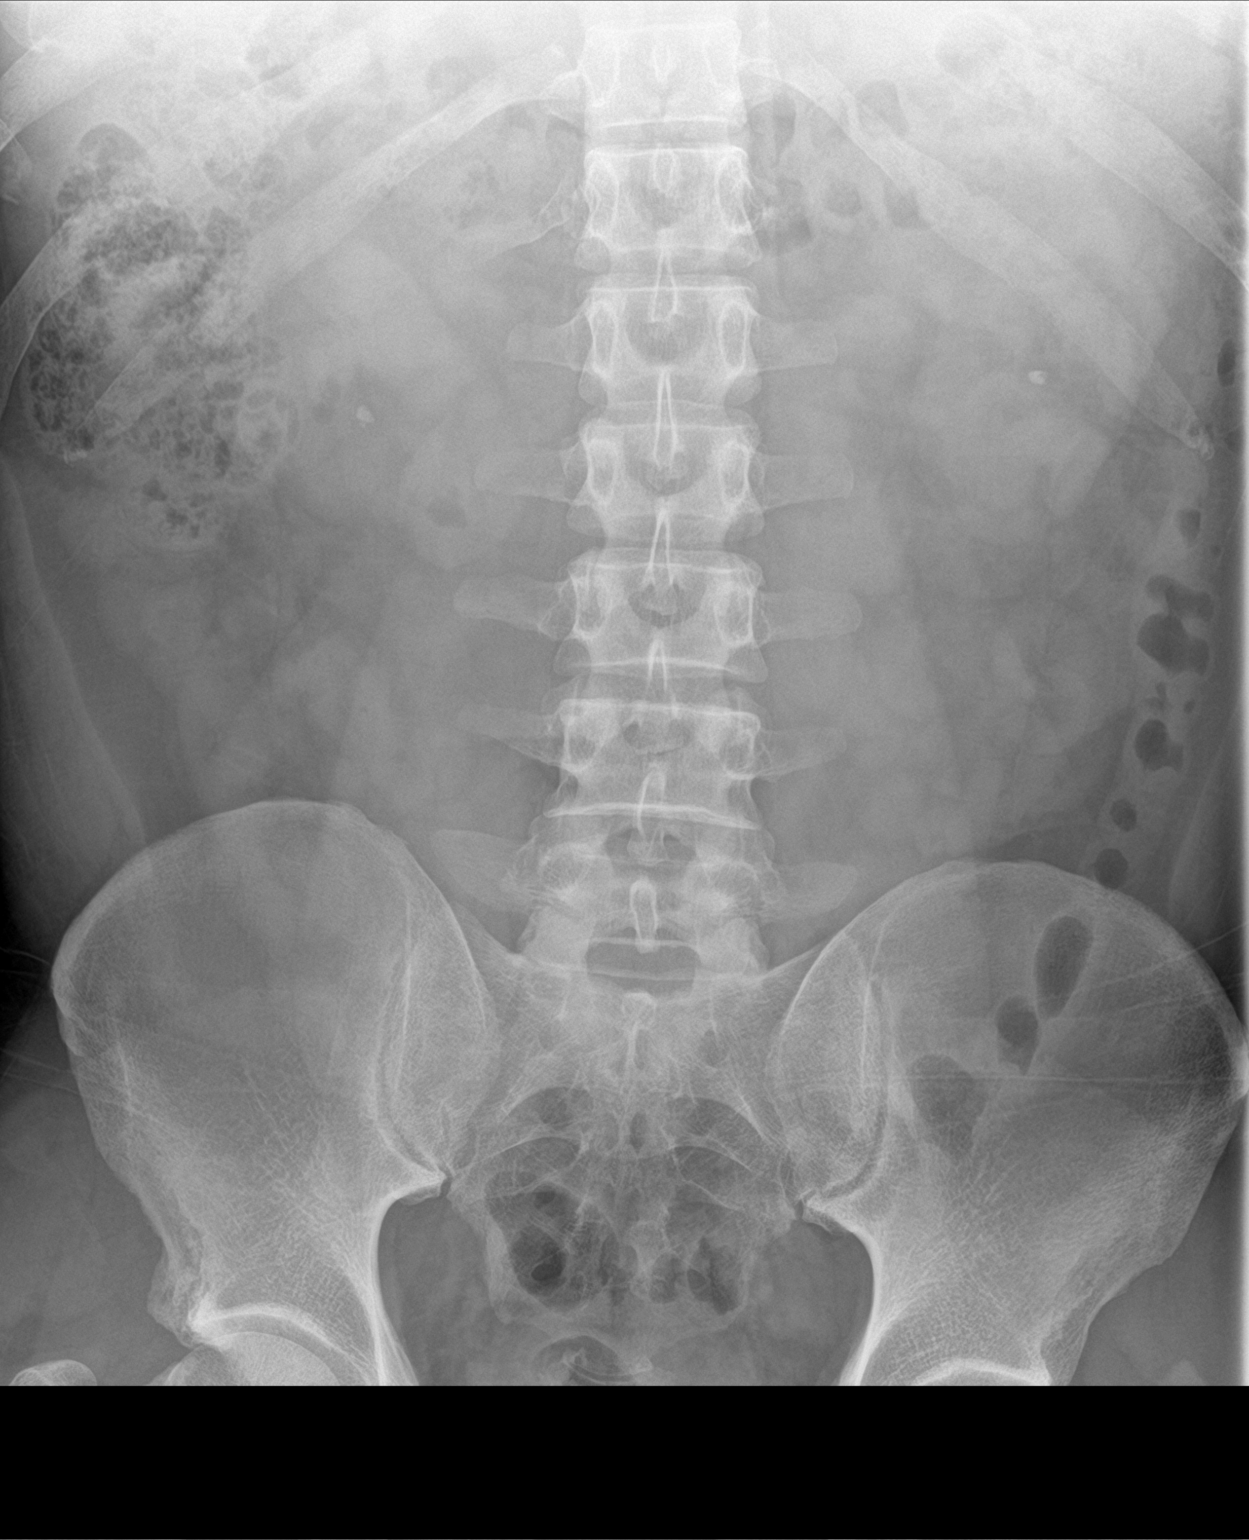
[im 2/2]
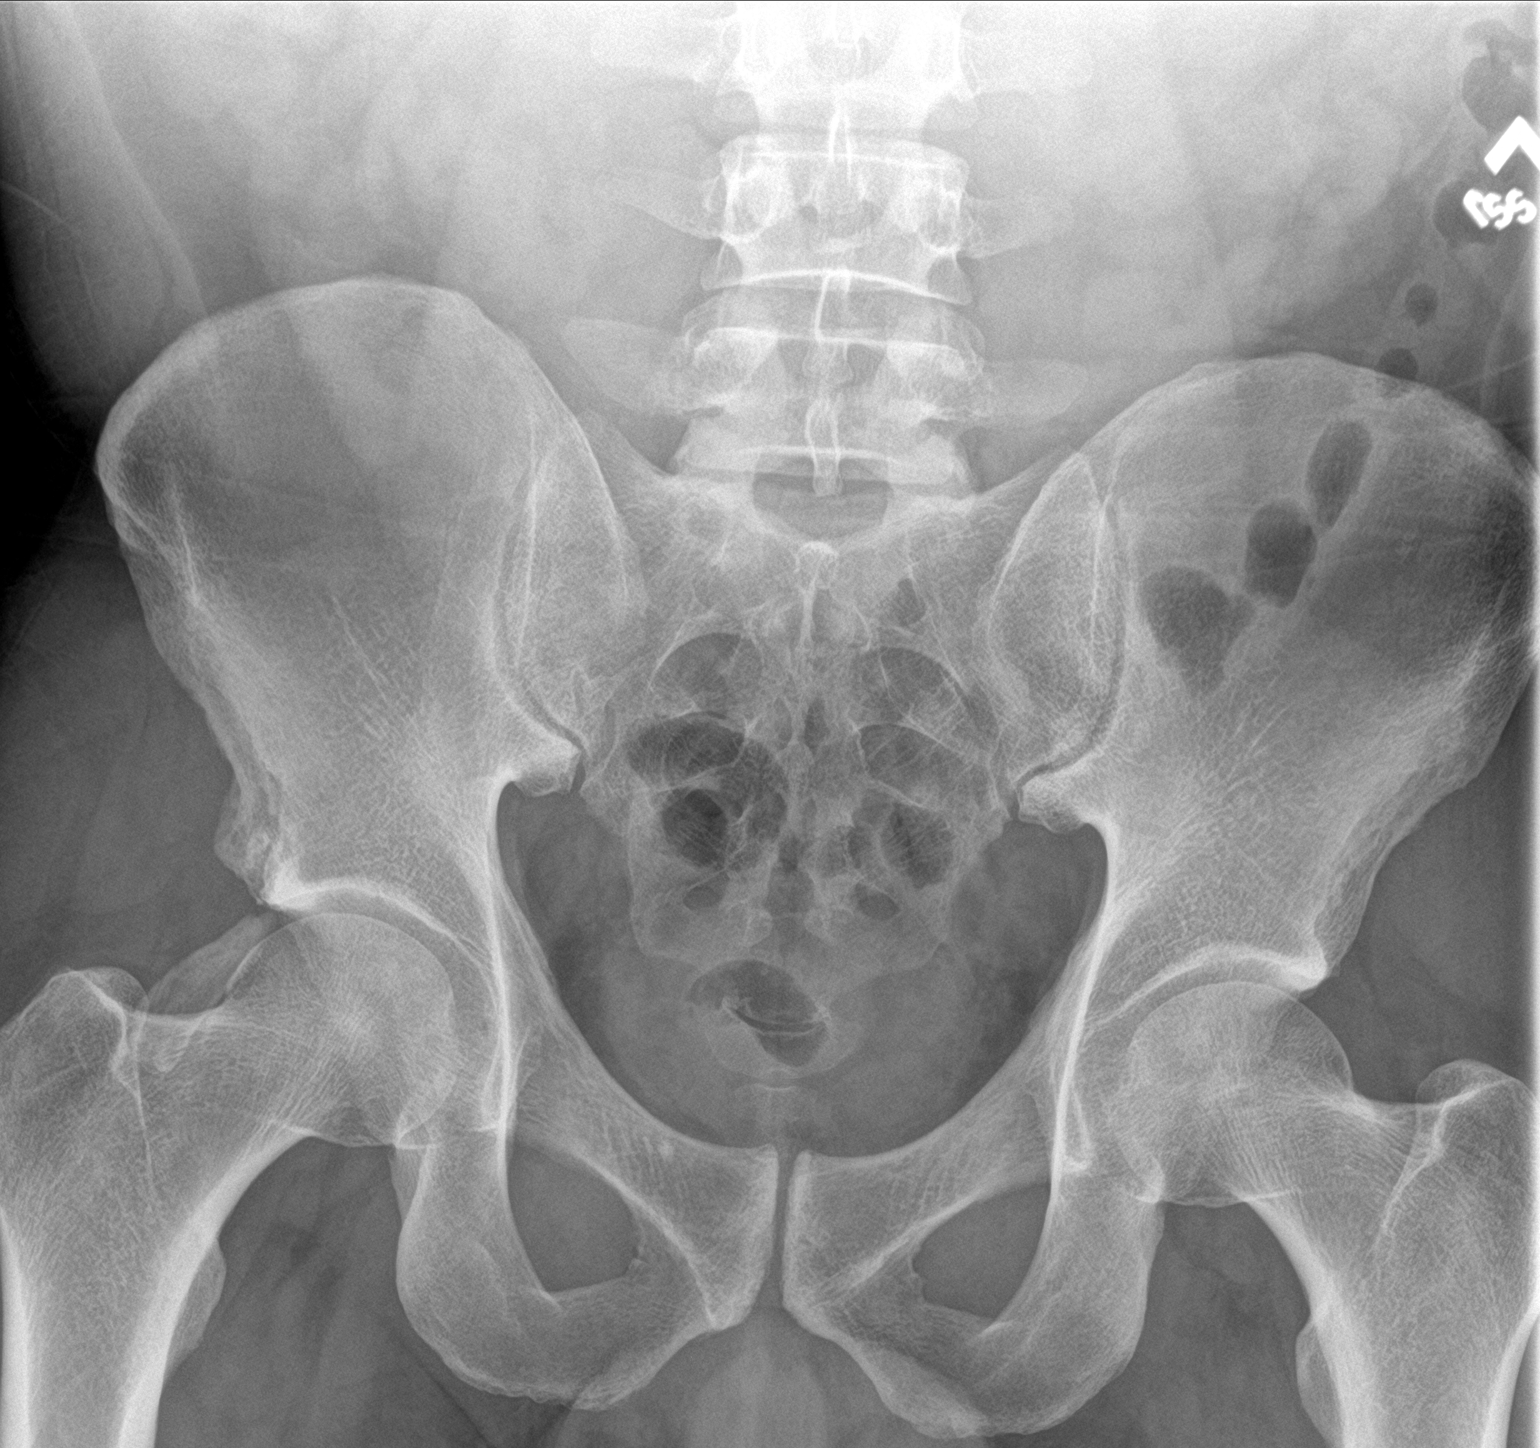

[2 of 2 positions shown; findings below may reference images not displayed]

FINDINGS: A stone remains in the lower left kidney measuring up to 4.8 mm.
There is a dominant stone in the lower right kidney measuring 5 mm.
An adjacent punctate stone is noted as well. A previously identified
ureteral stone is unchanged. A calcification deep in the right
pelvis has been stable since [DATE], consistent with a
phlebolith. No other abnormalities.
IMPRESSION: Bilateral renal stones as above. Interval resolution of the
previously identified ureteral stone. Of note, the stones in the
lower right kidney appear larger when compared to [DATE].

## 2018-03-11 MED ORDER — POTASSIUM CITRATE ER 15 MEQ (1620 MG) PO TBCR: 1.0000 | EXTENDED_RELEASE_TABLET | Freq: Two times a day (BID) | ORAL | 11 refills | Status: DC

## 2018-03-11 NOTE — Patient Instructions (Signed)
Dietary Guidelines to Help Prevent Kidney Stones  Kidney stones are deposits of minerals and salts that form inside your kidneys. Your risk of developing kidney stones may be greater depending on your diet, your lifestyle, the medicines you take, and whether you have certain medical conditions. Most people can reduce their chances of developing kidney stones by following the instructions below. Depending on your overall health and the type of kidney stones you tend to develop, your dietitian may give you more specific instructions.  What are tips for following this plan?  Reading food labels   Choose foods with "no salt added" or "low-salt" labels. Limit your sodium intake to less than 1500 mg per day.   Choose foods with calcium for each meal and snack. Try to eat about 300 mg of calcium at each meal. Foods that contain 200-500 mg of calcium per serving include:  ? 8 oz (237 ml) of milk, fortified nondairy milk, and fortified fruit juice.  ? 8 oz (237 ml) of kefir, yogurt, and soy yogurt.  ? 4 oz (118 ml) of tofu.  ? 1 oz of cheese.  ? 1 cup (300 g) of dried figs.  ? 1 cup (91 g) of cooked broccoli.  ? 1-3 oz can of sardines or mackerel.   Most people need 1000 to 1500 mg of calcium each day. Talk to your dietitian about how much calcium is recommended for you.  Shopping   Buy plenty of fresh fruits and vegetables. Most people do not need to avoid fruits and vegetables, even if they contain nutrients that may contribute to kidney stones.   When shopping for convenience foods, choose:  ? Whole pieces of fruit.  ? Premade salads with dressing on the side.  ? Low-fat fruit and yogurt smoothies.   Avoid buying frozen meals or prepared deli foods.   Look for foods with live cultures, such as yogurt and kefir.  Cooking   Do not add salt to food when cooking. Place a salt shaker on the table and allow each person to add his or her own salt to taste.   Use vegetable protein, such as beans, textured vegetable  protein (TVP), or tofu instead of meat in pasta, casseroles, and soups.  Meal planning     Eat less salt, if told by your dietitian. To do this:  ? Avoid eating processed or premade food.  ? Avoid eating fast food.   Eat less animal protein, including cheese, meat, poultry, or fish, if told by your dietitian. To do this:  ? Limit the number of times you have meat, poultry, fish, or cheese each week. Eat a diet free of meat at least 2 days a week.  ? Eat only one serving each day of meat, poultry, fish, or seafood.  ? When you prepare animal protein, cut pieces into small portion sizes. For most meat and fish, one serving is about the size of one deck of cards.   Eat at least 5 servings of fresh fruits and vegetables each day. To do this:  ? Keep fruits and vegetables on hand for snacks.  ? Eat 1 piece of fruit or a handful of berries with breakfast.  ? Have a salad and fruit at lunch.  ? Have two kinds of vegetables at dinner.   Limit foods that are high in a substance called oxalate. These include:  ? Spinach.  ? Rhubarb.  ? Beets.  ? Potato chips and french fries.  ? Nuts.     If you regularly take a diuretic medicine, make sure to eat at least 1-2 fruits or vegetables high in potassium each day. These include:  ? Avocado.  ? Banana.  ? Orange, prune, carrot, or tomato juice.  ? Baked potato.  ? Cabbage.  ? Beans and split peas.  General instructions     Drink enough fluid to keep your urine clear or pale yellow. This is the most important thing you can do.   Talk to your health care provider and dietitian about taking daily supplements. Depending on your health and the cause of your kidney stones, you may be advised:  ? Not to take supplements with vitamin C.  ? To take a calcium supplement.  ? To take a daily probiotic supplement.  ? To take other supplements such as magnesium, fish oil, or vitamin B6.   Take all medicines and supplements as told by your health care provider.   Limit alcohol intake to no  more than 1 drink a day for nonpregnant women and 2 drinks a day for men. One drink equals 12 oz of beer, 5 oz of wine, or 1 oz of hard liquor.   Lose weight if told by your health care provider. Work with your dietitian to find strategies and an eating plan that works best for you.  What foods are not recommended?  Limit your intake of the following foods, or as told by your dietitian. Talk to your dietitian about specific foods you should avoid based on the type of kidney stones and your overall health.  Grains  Breads. Bagels. Rolls. Baked goods. Salted crackers. Cereal. Pasta.  Vegetables  Spinach. Rhubarb. Beets. Canned vegetables. Pickles. Olives.  Meats and other protein foods  Nuts. Nut butters. Large portions of meat, poultry, or fish. Salted or cured meats. Deli meats. Hot dogs. Sausages.  Dairy  Cheese.  Beverages  Regular soft drinks. Regular vegetable juice.  Seasonings and other foods  Seasoning blends with salt. Salad dressings. Canned soups. Soy sauce. Ketchup. Barbecue sauce. Canned pasta sauce. Casseroles. Pizza. Lasagna. Frozen meals. Potato chips. French fries.  Summary   You can reduce your risk of kidney stones by making changes to your diet.   The most important thing you can do is drink enough fluid. You should drink enough fluid to keep your urine clear or pale yellow.   Ask your health care provider or dietitian how much protein from animal sources you should eat each day, and also how much salt and calcium you should have each day.  This information is not intended to replace advice given to you by your health care provider. Make sure you discuss any questions you have with your health care provider.  Document Released: 04/22/2010 Document Revised: 12/07/2015 Document Reviewed: 12/07/2015  Elsevier Interactive Patient Education  2019 [employer]

## 2018-03-11 NOTE — Progress Notes (Signed)
   03/11/2018 4:41 PM   Tyler Watson 06-Dec-1970 [id number]  Reason for visit: Follow up SWL, 24 hour urine results  HPI: I saw Tyler Watson back in urology clinic for follow-up after shockwave lithotripsy and to discuss his 24-hour urine results.  He is a healthy 48 year old male with history of recurrent calcium oxalate stone disease.  His KUB today shows complete clearance of residual fragments after his right SWL on 01/17/2018.  He previously completed a 24-hour urine in spring 2019 when he was followed by Dr. Lonna Cobb.  24 hour urine from 04/19/2017 scanned into Media tab Risk factors: Low urine volume 1.88L Very low urine citrate 197 Elevated urine oxalate Urine pH 5.5  Recommend minimizing high oxalate foods, add calcium to high oxalate meals, increase urine volume, and start Potassium citrate BID.  RTC 6 months with KUB prior  A total of 15 minutes were spent face-to-face with the patient, greater than 50% was spent in patient education, counseling, and coordination of care regarding KUB results, nephrolithiasis, and stone prevention.   Sondra Come, MD  [facility] [address] [place], [place] [address] [phone]

## 2018-08-05 DIAGNOSIS — M7672 Peroneal tendinitis, left leg: Secondary | ICD-10-CM | POA: Insufficient documentation

## 2018-09-23 ENCOUNTER — Ambulatory Visit: Payer: BLUE CROSS/BLUE SHIELD | Admitting: Urology

## 2018-10-22 ENCOUNTER — Ambulatory Visit
Admission: RE | Admit: 2018-10-22 | Discharge: 2018-10-22 | Disposition: A | Discharge status: Home / Self Care | Payer: BC Managed Care – PPO | Attending: Urology | Admitting: Urology

## 2018-10-22 ENCOUNTER — Encounter: Payer: Self-pay | Admitting: Urology

## 2018-10-22 ENCOUNTER — Ambulatory Visit
Admission: RE | Admit: 2018-10-22 | Discharge: 2018-10-22 | Disposition: A | Discharge status: Home / Self Care | Payer: BC Managed Care – PPO | Source: Ambulatory Visit | Attending: Urology | Admitting: Urology

## 2018-10-22 ENCOUNTER — Ambulatory Visit: Payer: BC Managed Care – PPO | Admitting: Urology

## 2018-10-22 ENCOUNTER — Other Ambulatory Visit: Payer: Self-pay

## 2018-10-22 VITALS — BP 124/88 | HR 68 | Ht 67.0 in | Wt 188.0 lb

## 2018-10-22 DIAGNOSIS — N200 Calculus of kidney: Secondary | ICD-10-CM

## 2018-10-22 IMAGING — CR DG ABDOMEN 1V
2 series · 2 of 2 positions shown · non-contrast
Comparison: [DATE]

CLINICAL DATA: Nephrolithiasis

EXAM:
ABDOMEN - 1 VIEW

[abdomen kub (1 of 2)]
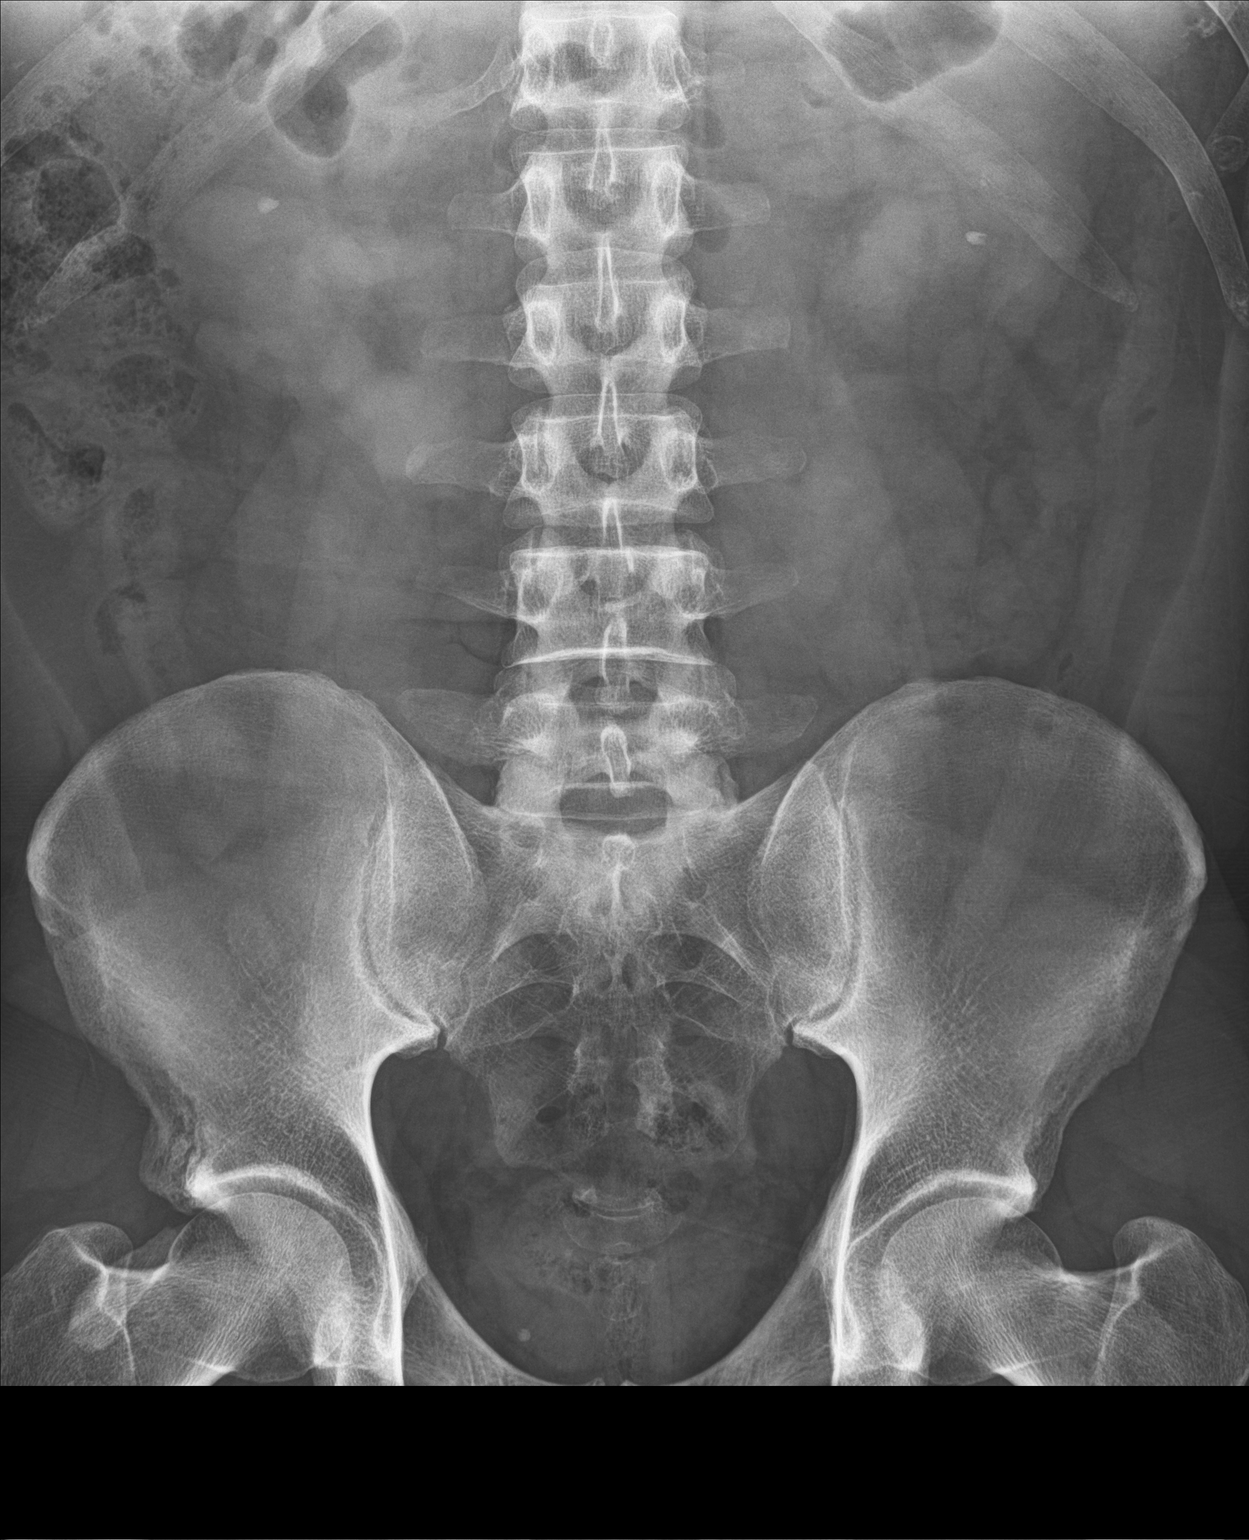

[abdomen kub (2 of 2)]
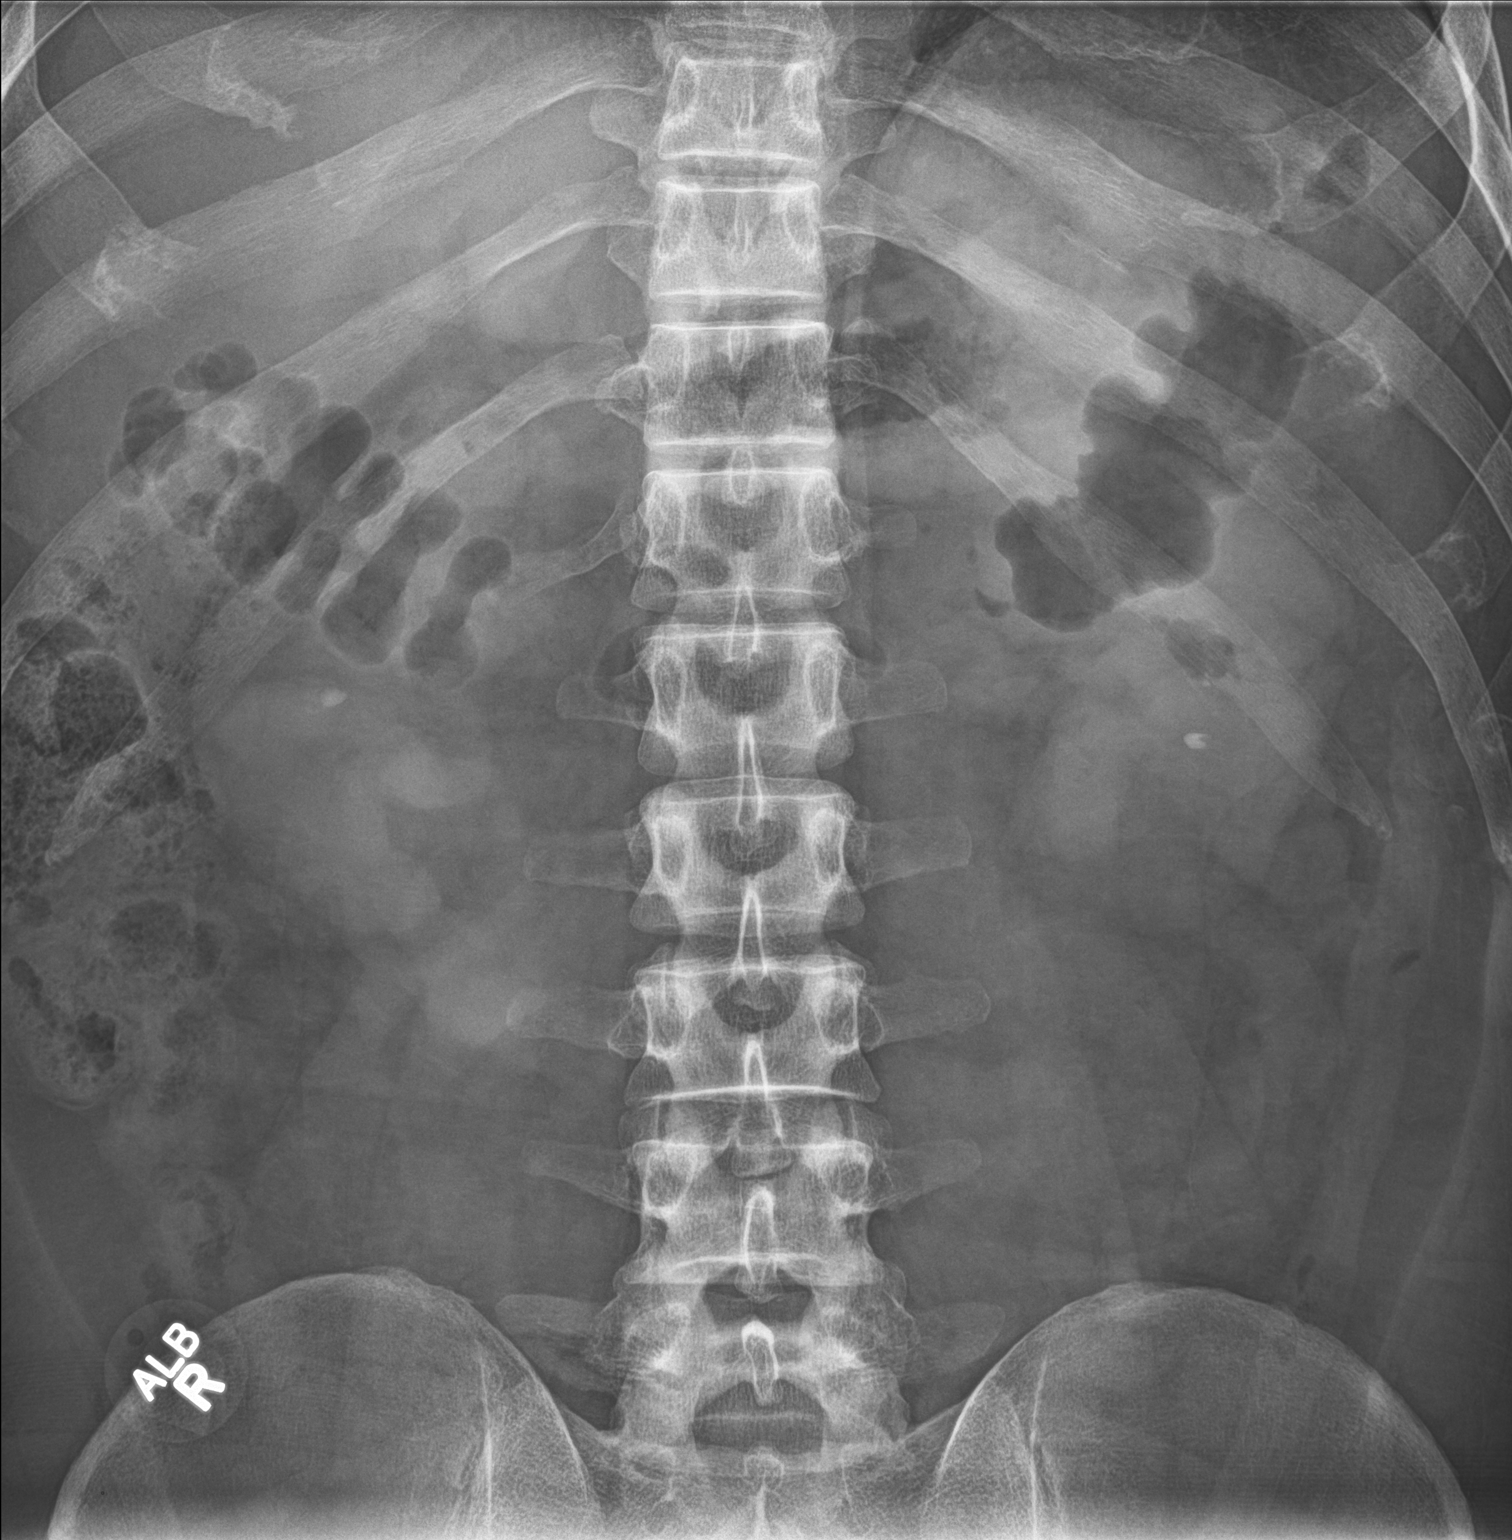

[2 of 2 positions shown; findings below may reference images not displayed]

FINDINGS: 5 mm lower pole LEFT renal calculus.

5 mm lower pole RIGHT renal calculus.

No change from comparison exam.

No ureterolithiasis identified. Phlebolith in the RIGHT lower pelvis
IMPRESSION: Stable bilateral renal calculi.

## 2018-10-22 MED ORDER — POTASSIUM CITRATE ER 15 MEQ (1620 MG) PO TBCR: 1.0000 | EXTENDED_RELEASE_TABLET | Freq: Two times a day (BID) | ORAL | 11 refills | Status: DC

## 2018-10-22 NOTE — Progress Notes (Signed)
   10/22/2018 9:30 AM   Tyler Watson 12-12-1970 [id number]  Reason for visit: Follow up nephrolithiasis  HPI: I saw Tyler Watson back in urology clinic for follow-up of nephrolithiasis.  He is a 48 year old healthy male that underwent right shockwave lithotripsy on 01/17/2018 for a 7 mm proximal stone.  He had clearance of all fragments on post operative KUB.  He denies any stone episodes, flank pain, gross hematuria since we saw him last.  He continues to take potassium citrate daily as started previously for the below risk factors on 24-hour urine in 04/2017.  He denies any GI side effects from the potassium citrate.  24 hour urine from 04/19/2017 scanned into Media tab Risk factors: Low urine volume 1.88L Very low urine citrate 197 Elevated urine oxalate Urine pH 5.5   ROS: Please see flowsheet from today's date for complete review of systems.  Physical Exam: BP 124/88 (BP Location: Left Arm, Patient Position: Sitting, Cuff Size: Normal)   Pulse 68   Ht 5\' 7"  (1.702 m)   Wt 188 lb (85.3 kg)   BMI 29.44 kg/m    Constitutional:  Alert and oriented, No acute distress. Respiratory: Normal respiratory effort, no increased work of breathing. GI: Abdomen is soft, nontender, nondistended, no abdominal masses GU: No CVA tenderness Skin: No rashes, bruises or suspicious lesions. Neurologic: Grossly intact, no focal deficits, moving all 4 extremities. Psychiatric: Normal mood and affect  Pertinent Imaging: I have personally reviewed the KUB today, stable 4 mm midpole nonobstructing stones bilaterally, no radiopaque calculi overlying the ureters  Assessment & Plan:   I had a long conversation with the patient today regarding his history of nephrolithiasis and ongoing stone prevention. I recommended continuing potassium citrate, pushing fluids with a goal of 2.5 L urine output per day, and considering adding with a litholyte 1 packet/day.  The potassium citrate is inexpensive for him  with his insurance, and he denies any GI side effects.  We discussed options for his nonobstructive stones bilaterally including elective shockwave lithotripsy or ureteroscopy, or ongoing surveillance.  He would like to pursue surveillance.  RTC 1 year with KUB prior, sooner if stone pain Continue potassium citrate  A total of 15 minutes were spent face-to-face with the patient, greater than 50% was spent in patient education, counseling, and coordination of care regarding nephrolithiasis and stone prevention.  Billey Co, [facility] [address], [place], [place] [address] [phone]

## 2018-10-22 NOTE — Patient Instructions (Addendum)
[email or web address] -> supplement to help prevent more kidney stones, add one packet daily to any drink, it is tasteless   Dietary Guidelines to Help Prevent Kidney Stones Kidney stones are deposits of minerals and salts that form inside your kidneys. Your risk of developing kidney stones may be greater depending on your diet, your lifestyle, the medicines you take, and whether you have certain medical conditions. Most people can reduce their chances of developing kidney stones by following the instructions below. Depending on your overall health and the type of kidney stones you tend to develop, your dietitian may give you more specific instructions. What are tips for following this plan? Reading food labels  Choose foods with "no salt added" or "low-salt" labels. Limit your sodium intake to less than 1500 mg per day.  Choose foods with calcium for each meal and snack. Try to eat about 300 mg of calcium at each meal. Foods that contain 200-500 mg of calcium per serving include: ? 8 oz (237 ml) of milk, fortified nondairy milk, and fortified fruit juice. ? 8 oz (237 ml) of kefir, yogurt, and soy yogurt. ? 4 oz (118 ml) of tofu. ? 1 oz of cheese. ? 1 cup (300 g) of dried figs. ? 1 cup (91 g) of cooked broccoli. ? 1-3 oz can of sardines or mackerel.  Most people need 1000 to 1500 mg of calcium each day. Talk to your dietitian about how much calcium is recommended for you. Shopping  Buy plenty of fresh fruits and vegetables. Most people do not need to avoid fruits and vegetables, even if they contain nutrients that may contribute to kidney stones.  When shopping for convenience foods, choose: ? Whole pieces of fruit. ? Premade salads with dressing on the side. ? Low-fat fruit and yogurt smoothies.  Avoid buying frozen meals or prepared deli foods.  Look for foods with live cultures, such as yogurt and kefir. Cooking  Do not add salt to food when cooking. Place a salt shaker on the table and  allow each person to add his or her own salt to taste.  Use vegetable protein, such as beans, textured vegetable protein (TVP), or tofu instead of meat in pasta, casseroles, and soups. Meal planning   Eat less salt, if told by your dietitian. To do this: ? Avoid eating processed or premade food. ? Avoid eating fast food.  Eat less animal protein, including cheese, meat, poultry, or fish, if told by your dietitian. To do this: ? Limit the number of times you have meat, poultry, fish, or cheese each week. Eat a diet free of meat at least 2 days a week. ? Eat only one serving each day of meat, poultry, fish, or seafood. ? When you prepare animal protein, cut pieces into small portion sizes. For most meat and fish, one serving is about the size of one deck of cards.  Eat at least 5 servings of fresh fruits and vegetables each day. To do this: ? Keep fruits and vegetables on hand for snacks. ? Eat 1 piece of fruit or a handful of berries with breakfast. ? Have a salad and fruit at lunch. ? Have two kinds of vegetables at dinner.  Limit foods that are high in a substance called oxalate. These include: ? Spinach. ? Rhubarb. ? Beets. ? Potato chips and french fries. ? Nuts.  If you regularly take a diuretic medicine, make sure to eat at least 1-2 fruits or vegetables high in potassium each day. These  include: ? Avocado. ? Banana. ? Orange, prune, carrot, or tomato juice. ? Baked potato. ? Cabbage. ? Beans and split peas. General instructions   Drink enough fluid to keep your urine clear or pale yellow. This is the most important thing you can do.  Talk to your health care provider and dietitian about taking daily supplements. Depending on your health and the cause of your kidney stones, you may be advised: ? Not to take supplements with vitamin C. ? To take a calcium supplement. ? To take a daily probiotic supplement. ? To take other supplements such as magnesium, fish oil, or  vitamin B6.  Take all medicines and supplements as told by your health care provider.  Limit alcohol intake to no more than 1 drink a day for nonpregnant women and 2 drinks a day for men. One drink equals 12 oz of beer, 5 oz of wine, or 1 oz of hard liquor.  Lose weight if told by your health care provider. Work with your dietitian to find strategies and an eating plan that works best for you. What foods are not recommended? Limit your intake of the following foods, or as told by your dietitian. Talk to your dietitian about specific foods you should avoid based on the type of kidney stones and your overall health. Grains Breads. Bagels. Rolls. Baked goods. Salted crackers. Cereal. Pasta. Vegetables Spinach. Rhubarb. Beets. Canned vegetables. Angie Fava. Olives. Meats and other protein foods Nuts. Nut butters. Large portions of meat, poultry, or fish. Salted or cured meats. Deli meats. Hot dogs. Sausages. Dairy Cheese. Beverages Regular soft drinks. Regular vegetable juice. Seasonings and other foods Seasoning blends with salt. Salad dressings. Canned soups. Soy sauce. Ketchup. Barbecue sauce. Canned pasta sauce. Casseroles. Pizza. Lasagna. Frozen meals. Potato chips. [place] fries. Summary  You can reduce your risk of kidney stones by making changes to your diet.  The most important thing you can do is drink enough fluid. You should drink enough fluid to keep your urine clear or pale yellow.  Ask your health care provider or dietitian how much protein from animal sources you should eat each day, and also how much salt and calcium you should have each day. This information is not intended to replace advice given to you by your health care provider. Make sure you discuss any questions you have with your health care provider. Document Released: 04/22/2010 Document Revised: 04/17/2018 Document Reviewed: 12/07/2015 Elsevier Patient Education  2020 [employer].

## 2019-03-30 ENCOUNTER — Ambulatory Visit: Payer: BC Managed Care – PPO | Attending: Internal Medicine

## 2019-03-30 DIAGNOSIS — Z23 Encounter for immunization: Secondary | ICD-10-CM

## 2019-03-30 NOTE — Progress Notes (Signed)
   Covid-19 Vaccination Clinic  Name:  Tyler Watson    MRN: [id number] DOB: October 03, 1970  03/30/2019  [name] was observed post Covid-19 immunization for 15 minutes without incident. He was provided with Vaccine Information Sheet and instruction to access the V-Safe system.   [name] was instructed to call 911 with any severe reactions post vaccine: Marland Kitchen Difficulty breathing  . Swelling of face and throat  . A fast heartbeat  . A bad rash all over body  . Dizziness and weakness   Immunizations Administered    Name Date Dose VIS Date Route   Pfizer COVID-19 Vaccine 03/30/2019 11:37 AM 0.3 mL 12/20/2018 Intramuscular   Manufacturer: [employer], [employer]   Lot: [id number]   NDC: [address]-7

## 2019-04-20 ENCOUNTER — Ambulatory Visit: Payer: Self-pay | Attending: Internal Medicine

## 2019-04-20 DIAGNOSIS — Z23 Encounter for immunization: Secondary | ICD-10-CM

## 2019-04-20 NOTE — Progress Notes (Signed)
   Covid-19 Vaccination Clinic  Name:  ZAKEE DEERMAN    MRN: [id number] DOB: 05-20-1970  04/20/2019  Mr. Krider was observed post Covid-19 immunization for 15 minutes without incident. He was provided with Vaccine Information Sheet and instruction to access the V-Safe system.   Mr. Arteaga was instructed to call 911 with any severe reactions post vaccine: Marland Kitchen Difficulty breathing  . Swelling of face and throat  . A fast heartbeat  . A bad rash all over body  . Dizziness and weakness   Immunizations Administered    Name Date Dose VIS Date Route   Pfizer COVID-19 Vaccine 04/20/2019 10:08 AM 0.3 mL 12/20/2018 Intramuscular   Manufacturer: [employer], [employer]   Lot: [id number]   NDC: [address]-4

## 2019-07-30 ENCOUNTER — Encounter: Payer: Self-pay | Admitting: Urology

## 2019-10-21 ENCOUNTER — Ambulatory Visit: Payer: BC Managed Care – PPO | Admitting: Urology

## 2019-10-24 ENCOUNTER — Ambulatory Visit
Admission: RE | Admit: 2019-10-24 | Discharge: 2019-10-24 | Disposition: A | Discharge status: Home / Self Care | Payer: 59 | Attending: Urology | Admitting: Urology

## 2019-10-24 ENCOUNTER — Other Ambulatory Visit: Payer: Self-pay

## 2019-10-24 ENCOUNTER — Other Ambulatory Visit: Payer: Self-pay | Admitting: Physician Assistant

## 2019-10-24 ENCOUNTER — Ambulatory Visit
Admission: RE | Admit: 2019-10-24 | Discharge: 2019-10-24 | Disposition: A | Discharge status: Home / Self Care | Payer: 59 | Source: Ambulatory Visit | Attending: Physician Assistant | Admitting: Physician Assistant

## 2019-10-24 DIAGNOSIS — N200 Calculus of kidney: Secondary | ICD-10-CM

## 2019-10-24 IMAGING — CR DG ABDOMEN 1V
2 series · 2 of 2 positions shown · non-contrast
Comparison: [DATE]

CLINICAL DATA: Nephrolithiasis.

EXAM:
ABDOMEN - 1 VIEW

[abdomen kub (1 of 2)]
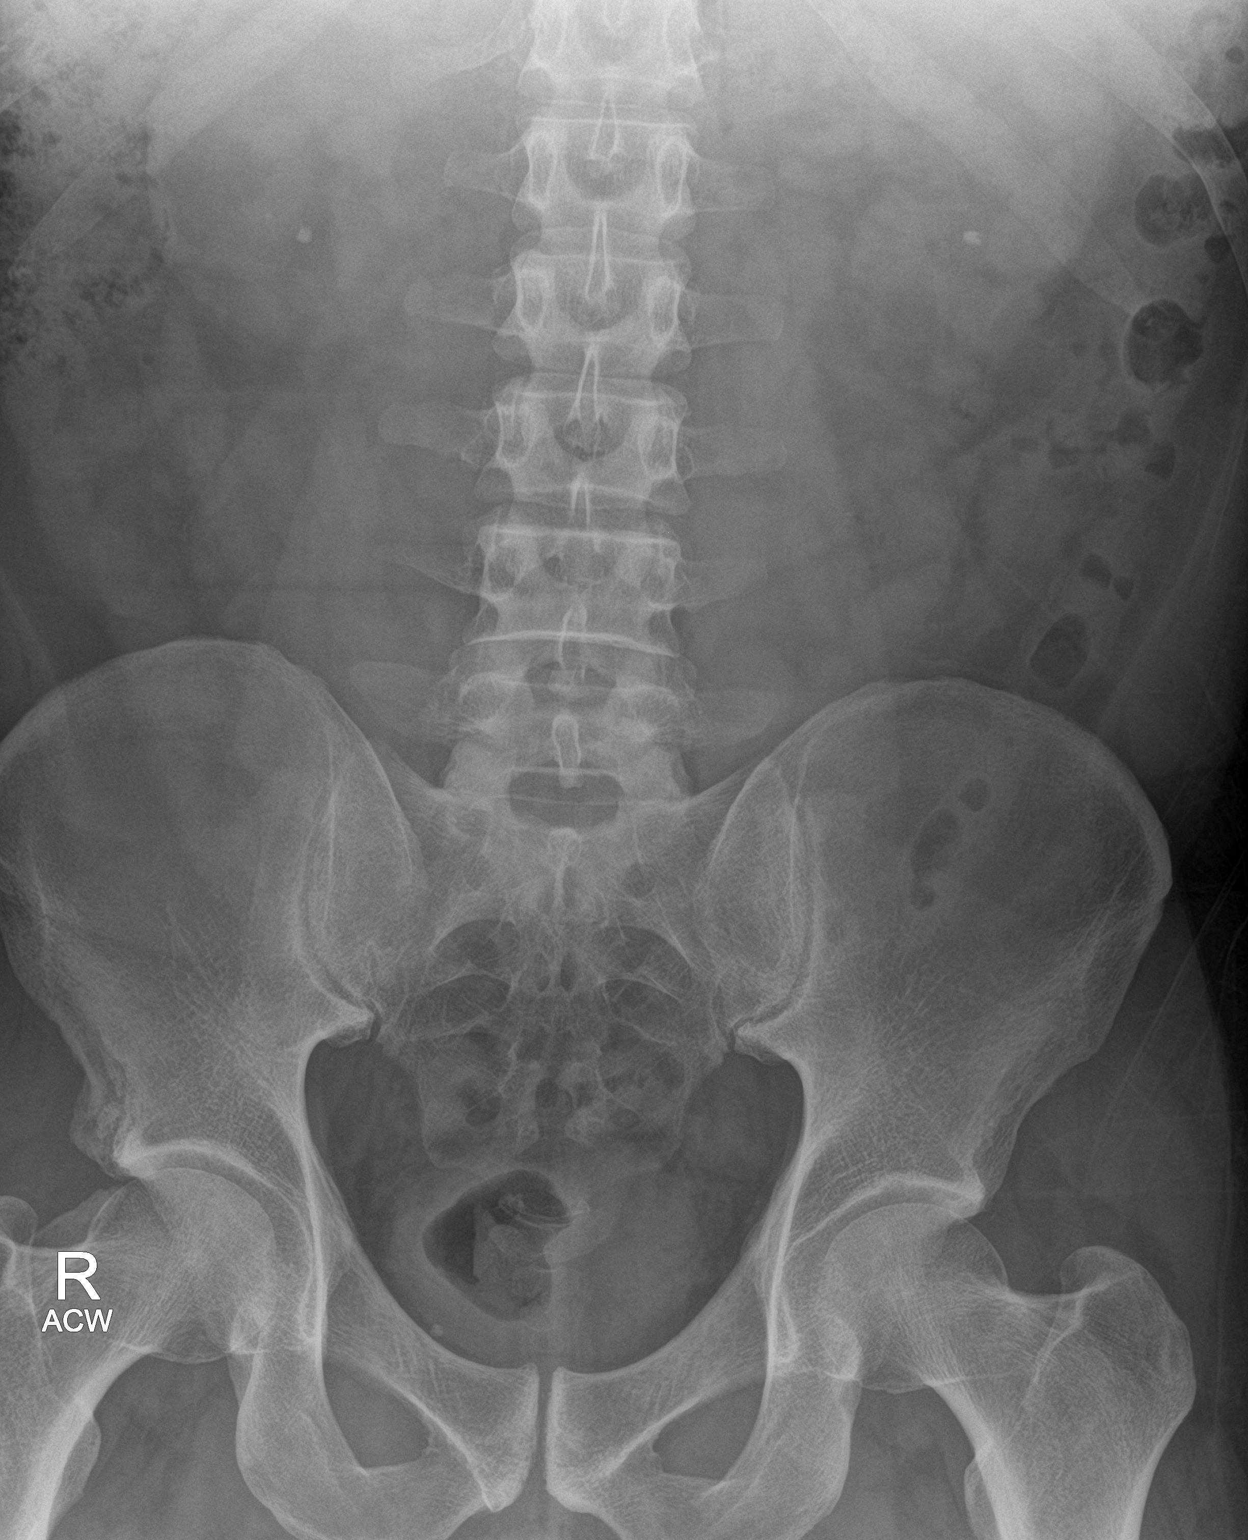

[abdomen kub (2 of 2)]
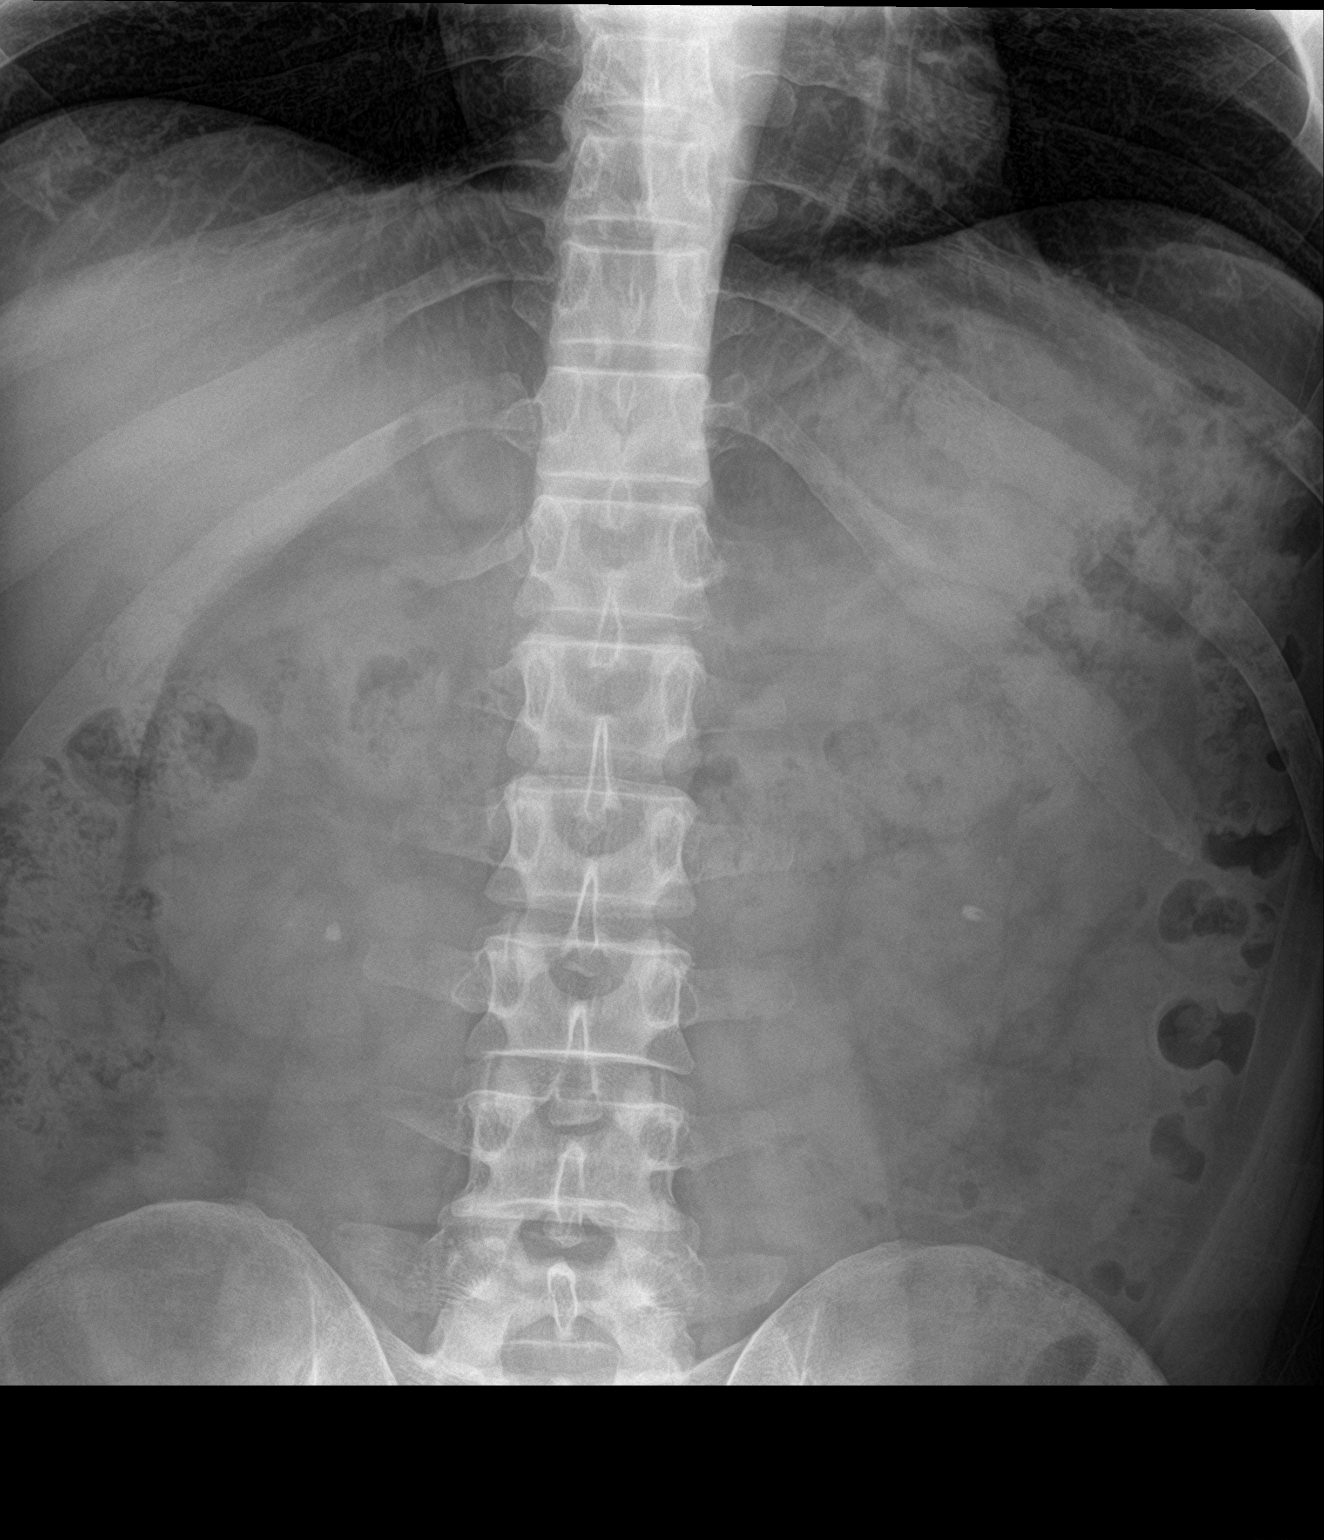

[2 of 2 positions shown; findings below may reference images not displayed]

FINDINGS: Approximately 4 mm calculus in the lower pole the right kidney. This
calculus likely is in a different location than on the prior.

Approximately 5 mm calculus in the inferior pole left renal kidney,
which appears similar to prior.

Findings are similar to prior. No ureterolithiasis identified.
Similar calcified phlebolith in the right anatomic pelvis.
Nonobstructive bowel gas pattern with gas in nondistended colon. The
visualized lung bases are unremarkable.
IMPRESSION: Bilateral renal calculi, as above.

## 2019-10-28 ENCOUNTER — Ambulatory Visit: Payer: 59 | Admitting: Urology

## 2019-10-28 ENCOUNTER — Encounter: Payer: Self-pay | Admitting: Urology

## 2019-10-28 ENCOUNTER — Other Ambulatory Visit: Payer: Self-pay

## 2019-10-28 VITALS — BP 114/75 | HR 76 | Ht 67.0 in | Wt 188.8 lb

## 2019-10-28 DIAGNOSIS — N200 Calculus of kidney: Secondary | ICD-10-CM

## 2019-10-28 NOTE — Patient Instructions (Signed)
Dietary Guidelines to Help Prevent Kidney Stones Kidney stones are deposits of minerals and salts that form inside your kidneys. Your risk of developing kidney stones may be greater depending on your diet, your lifestyle, the medicines you take, and whether you have certain medical conditions. Most people can reduce their chances of developing kidney stones by following the instructions below. Depending on your overall health and the type of kidney stones you tend to develop, your dietitian may give you more specific instructions. What are tips for following this plan? Reading food labels  Choose foods with "no salt added" or "low-salt" labels. Limit your sodium intake to less than 1500 mg per day.  Choose foods with calcium for each meal and snack. Try to eat about 300 mg of calcium at each meal. Foods that contain 200-500 mg of calcium per serving include: ? 8 oz (237 ml) of milk, fortified nondairy milk, and fortified fruit juice. ? 8 oz (237 ml) of kefir, yogurt, and soy yogurt. ? 4 oz (118 ml) of tofu. ? 1 oz of cheese. ? 1 cup (300 g) of dried figs. ? 1 cup (91 g) of cooked broccoli. ? 1-3 oz can of sardines or mackerel.  Most people need 1000 to 1500 mg of calcium each day. Talk to your dietitian about how much calcium is recommended for you. Shopping  Buy plenty of fresh fruits and vegetables. Most people do not need to avoid fruits and vegetables, even if they contain nutrients that may contribute to kidney stones.  When shopping for convenience foods, choose: ? Whole pieces of fruit. ? Premade salads with dressing on the side. ? Low-fat fruit and yogurt smoothies.  Avoid buying frozen meals or prepared deli foods.  Look for foods with live cultures, such as yogurt and kefir. Cooking  Do not add salt to food when cooking. Place a salt shaker on the table and allow each person to add his or her own salt to taste.  Use vegetable protein, such as beans, textured vegetable  protein (TVP), or tofu instead of meat in pasta, casseroles, and soups. Meal planning   Eat less salt, if told by your dietitian. To do this: ? Avoid eating processed or premade food. ? Avoid eating fast food.  Eat less animal protein, including cheese, meat, poultry, or fish, if told by your dietitian. To do this: ? Limit the number of times you have meat, poultry, fish, or cheese each week. Eat a diet free of meat at least 2 days a week. ? Eat only one serving each day of meat, poultry, fish, or seafood. ? When you prepare animal protein, cut pieces into small portion sizes. For most meat and fish, one serving is about the size of one deck of cards.  Eat at least 5 servings of fresh fruits and vegetables each day. To do this: ? Keep fruits and vegetables on hand for snacks. ? Eat 1 piece of fruit or a handful of berries with breakfast. ? Have a salad and fruit at lunch. ? Have two kinds of vegetables at dinner.  Limit foods that are high in a substance called oxalate. These include: ? Spinach. ? Rhubarb. ? Beets. ? Potato chips and french fries. ? Nuts.  If you regularly take a diuretic medicine, make sure to eat at least 1-2 fruits or vegetables high in potassium each day. These include: ? Avocado. ? Banana. ? Orange, prune, carrot, or tomato juice. ? Baked potato. ? Cabbage. ? Beans and split   peas. General instructions   Drink enough fluid to keep your urine clear or pale yellow. This is the most important thing you can do.  Talk to your health care provider and dietitian about taking daily supplements. Depending on your health and the cause of your kidney stones, you may be advised: ? Not to take supplements with vitamin C. ? To take a calcium supplement. ? To take a daily probiotic supplement. ? To take other supplements such as magnesium, fish oil, or vitamin B6.  Take all medicines and supplements as told by your health care provider.  Limit alcohol intake to no  more than 1 drink a day for nonpregnant women and 2 drinks a day for men. One drink equals 12 oz of beer, 5 oz of wine, or 1 oz of hard liquor.  Lose weight if told by your health care provider. Work with your dietitian to find strategies and an eating plan that works best for you. What foods are not recommended? Limit your intake of the following foods, or as told by your dietitian. Talk to your dietitian about specific foods you should avoid based on the type of kidney stones and your overall health. Grains Breads. Bagels. Rolls. Baked goods. Salted crackers. Cereal. Pasta. Vegetables Spinach. Rhubarb. Beets. Canned vegetables. Pickles. Olives. Meats and other protein foods Nuts. Nut butters. Large portions of meat, poultry, or fish. Salted or cured meats. Deli meats. Hot dogs. Sausages. Dairy Cheese. Beverages Regular soft drinks. Regular vegetable juice. Seasonings and other foods Seasoning blends with salt. Salad dressings. Canned soups. Soy sauce. Ketchup. Barbecue sauce. Canned pasta sauce. Casseroles. Pizza. Lasagna. Frozen meals. Potato chips. French fries. Summary  You can reduce your risk of kidney stones by making changes to your diet.  The most important thing you can do is drink enough fluid. You should drink enough fluid to keep your urine clear or pale yellow.  Ask your health care provider or dietitian how much protein from animal sources you should eat each day, and also how much salt and calcium you should have each day. This information is not intended to replace advice given to you by your health care provider. Make sure you discuss any questions you have with your health care provider. Document Revised: 04/17/2018 Document Reviewed: 12/07/2015 Elsevier Patient Education  2020 Elsevier Inc.  

## 2019-10-28 NOTE — Progress Notes (Signed)
° °  10/28/2019 3:52 PM   Cher C Pastorino 1970/03/21 [id number]  Reason for visit: Follow up nephrolithiasis  HPI: I saw Mr. Besancon back in urology clinic for follow-up of nephrolithiasis.  He is a 49 year old healthy male who underwent uncomplicated right shockwave lithotripsy in January 2020 for a right 7 mm proximal ureteral stone.  Follow-up KUB showed clearance of fragments.  We have been monitoring bilateral 5 mm lower pole stones with yearly KUB.  We previously discussed options including ureteroscopy, ESWL, or surveillance, and he is opted for surveillance.  He completed a 24-hour urine previously in April 2019 which showed risk factors of low urine volume 1.88 L, very low urine citrate of 197, elevated urine oxalate, and urine pH of 5.5.  He was previously on potassium citrate, but discontinued this medication, as he saw some capsules in his stool, and felt like it was not being absorbed.  We reviewed that it is common to see the capsules in the stool, and this does not correlate with decreased absorption.  He has been off the medication for 6 months, and does not want to resume potassium citrate.  I personally reviewed his CT today that shows stable bilateral 5 mm lower pole stones, unchanged significantly from last year.  We discussed general stone prevention strategies including adequate hydration with goal of producing 2.5 L of urine daily, increasing citric acid intake, increasing calcium intake during high oxalate meals, minimizing animal protein, and decreasing salt intake. Information about dietary recommendations given today.  I stressed the importance of high urine volume and increasing citrate-containing foods like lemon juice and fruits and vegetables based on his prior 24-hour urine results.  We discussed return precautions.  RTC 1 year with KUB prior  Sondra Come, MD  [facility] [address], [address] [place], [place] [address] [phone]

## 2019-11-24 ENCOUNTER — Ambulatory Visit: Payer: 59 | Attending: Internal Medicine

## 2019-11-24 DIAGNOSIS — Z23 Encounter for immunization: Secondary | ICD-10-CM

## 2019-11-24 NOTE — Progress Notes (Signed)
   Covid-19 Vaccination Clinic  Name:  RAIDER VALBUENA    MRN: [id number] DOB: 02-03-1970  11/24/2019  Mr. Orosz was observed post Covid-19 immunization for 15 minutes without incident. He was provided with Vaccine Information Sheet and instruction to access the V-Safe system.   Mr. Roarty was instructed to call 911 with any severe reactions post vaccine: Marland Kitchen Difficulty breathing  . Swelling of face and throat  . A fast heartbeat  . A bad rash all over body  . Dizziness and weakness   Immunizations Administered    Name Date Dose VIS Date Route   Pfizer COVID-19 Vaccine 11/24/2019  3:50 PM 0.3 mL 10/29/2019 Intramuscular   Manufacturer: [employer], [employer]   Lot: [id number]   NDC: [address]-2

## 2020-08-12 ENCOUNTER — Other Ambulatory Visit (HOSPITAL_COMMUNITY): Payer: Self-pay | Admitting: Podiatry

## 2020-08-12 ENCOUNTER — Other Ambulatory Visit: Payer: Self-pay | Admitting: Podiatry

## 2020-08-12 DIAGNOSIS — M79672 Pain in left foot: Secondary | ICD-10-CM

## 2020-08-12 DIAGNOSIS — M25572 Pain in left ankle and joints of left foot: Secondary | ICD-10-CM

## 2020-08-23 ENCOUNTER — Ambulatory Visit
Admission: RE | Admit: 2020-08-23 | Discharge: 2020-08-23 | Disposition: A | Discharge status: Home / Self Care | Payer: 59 | Source: Ambulatory Visit | Attending: Podiatry | Admitting: Podiatry

## 2020-08-23 ENCOUNTER — Other Ambulatory Visit: Payer: Self-pay

## 2020-08-23 DIAGNOSIS — M25572 Pain in left ankle and joints of left foot: Secondary | ICD-10-CM | POA: Insufficient documentation

## 2020-08-23 DIAGNOSIS — M79672 Pain in left foot: Secondary | ICD-10-CM | POA: Diagnosis present

## 2020-08-23 IMAGING — MR MR FOOT*L* W/O CM
5 series · 40 of 40 positions shown · non-contrast
Comparison: None.

CLINICAL DATA: Foot pain

EXAM:
MRI OF THE LEFT FOOT WITHOUT CONTRAST
TECHNIQUE: Multiplanar, multisequence MR imaging of the left foot was
performed. No intravenous contrast was administered.

[Series 4: T1 · coronal · left · 3.0mm · 0.38mm/px · 12 of 45 slices shown (1 of 2)]
[im 1/45]
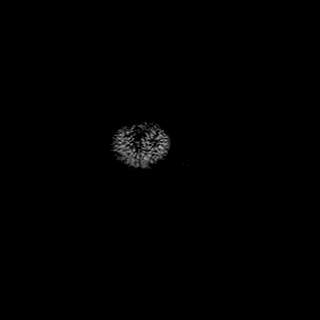
[im 5/45]
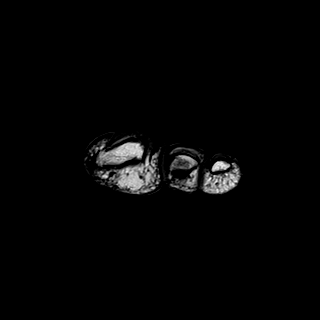
[im 9/45]
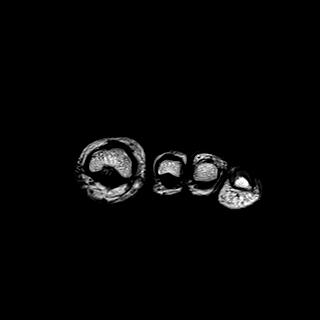
[im 13/45]
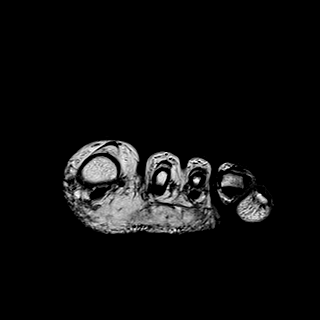
[im 17/45]
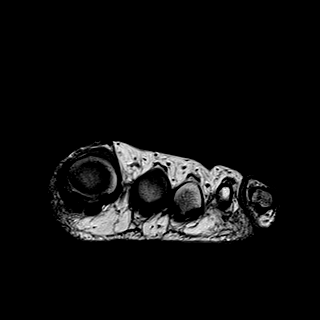
[im 21/45]
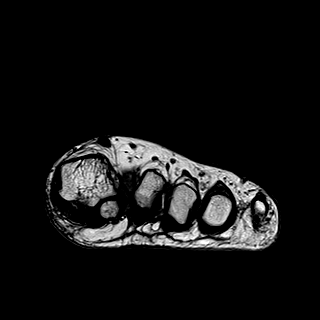
[im 25/45]
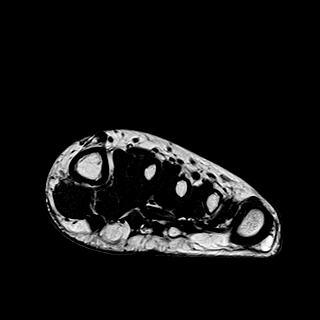
[im 29/45]
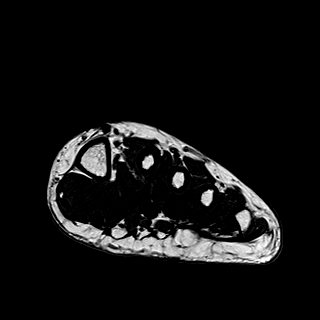
[im 33/45]
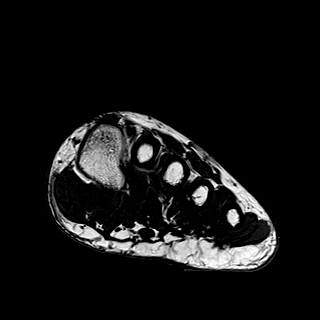
[im 37/45]
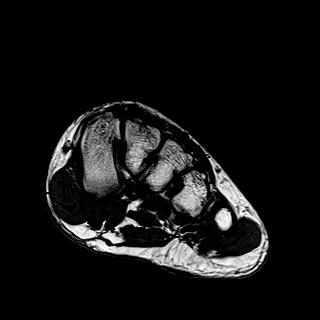
[im 41/45]
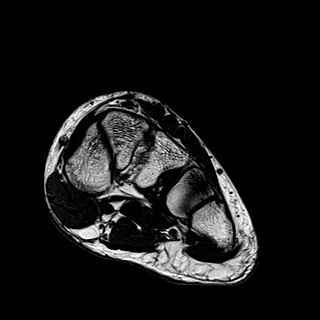
[im 45/45]
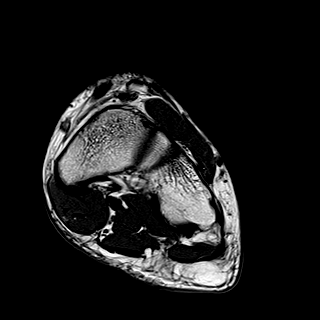

[Series 6: T2 · coronal · left · 3.0mm · 0.38mm/px · 11 of 45 slices shown (1 of 2)]
[im 1/45]
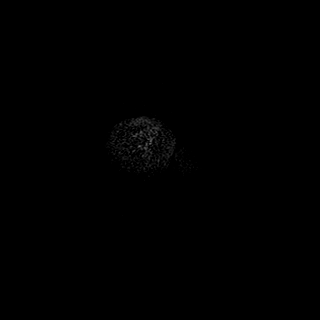
[im 5/45]
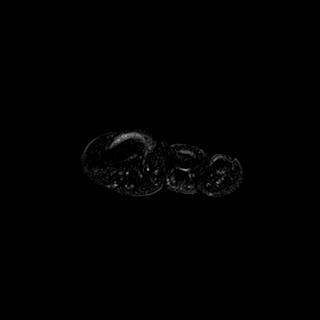
[im 9/45]
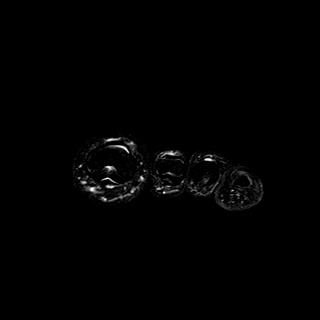
[im 14/45]
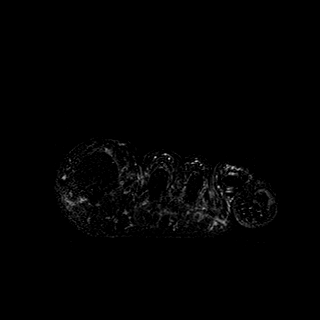
[im 18/45]
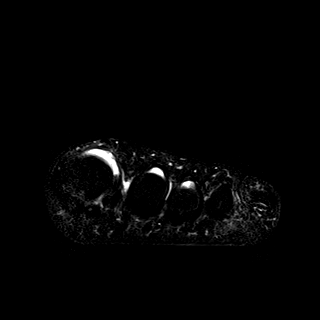
[im 23/45]
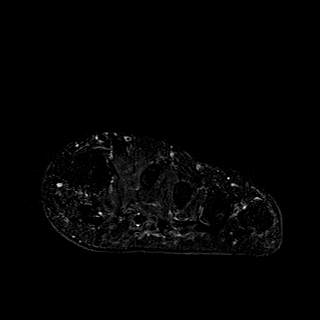
[im 27/45]
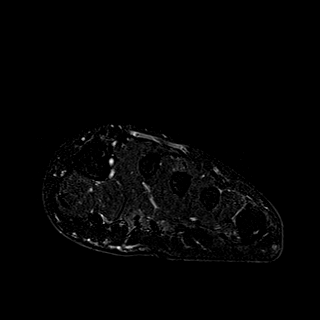
[im 31/45]
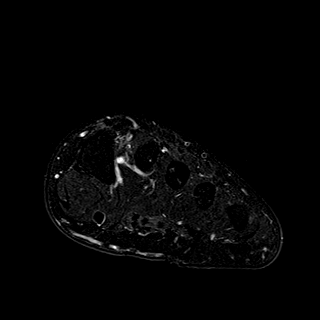
[im 36/45]
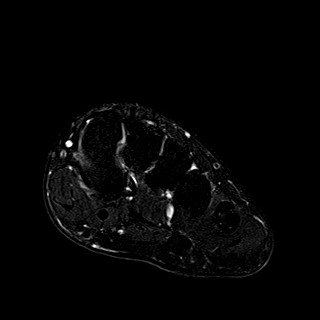
[im 40/45]
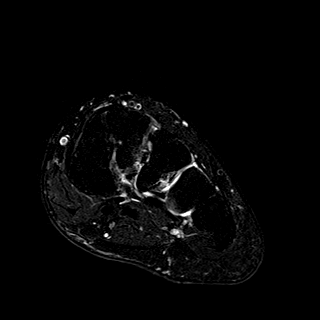
[im 45/45]
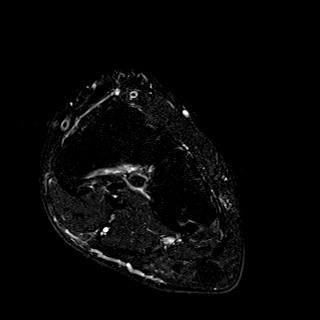

[Series 7: T1 · axial · left · 3.0mm · 0.70mm/px · z∈[-32,+41]mm · 5 of 20 slices shown (2 of 2)]
[im 1/20]
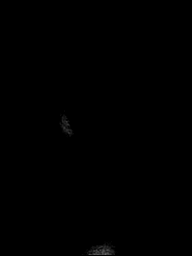
[im 5/20]
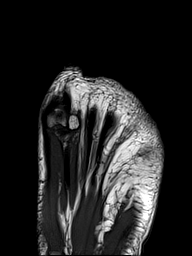
[im 10/20]
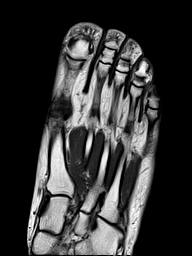
[im 15/20]
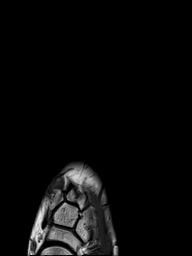
[im 20/20]
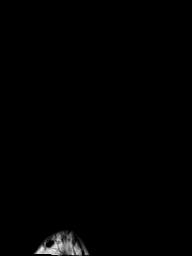

[Series 9: T2 · axial · left · 3.0mm · 0.70mm/px · z∈[-32,+41]mm · 5 of 20 slices shown (2 of 2)]
[im 1/20]
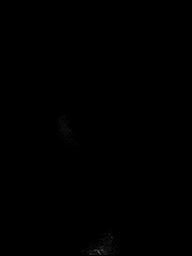
[im 5/20]
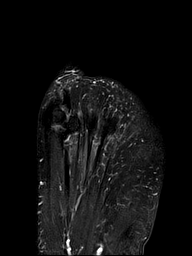
[im 10/20]
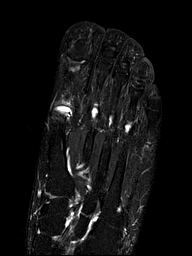
[im 15/20]
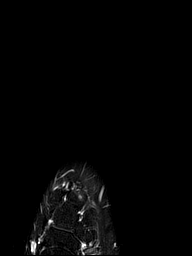
[im 20/20]
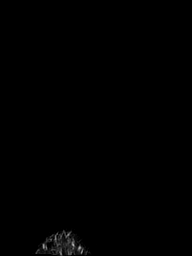

[Series 10: STIR · sagittal · left · 3.0mm · 0.62mm/px · 7 of 27 slices shown]
[im 1/27]
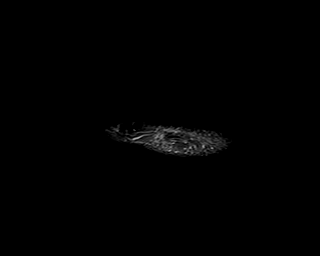
[im 5/27]
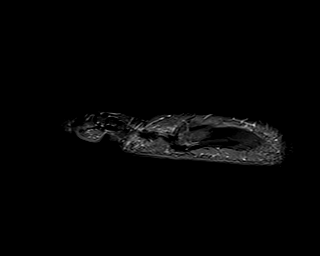
[im 9/27]
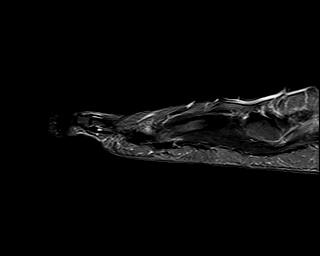
[im 14/27]
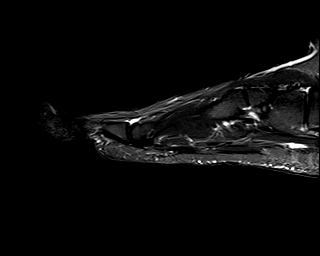
[im 18/27]
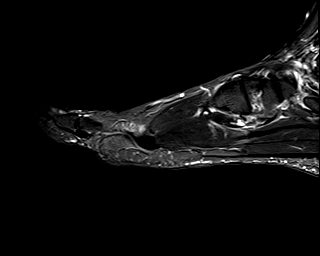
[im 22/27]
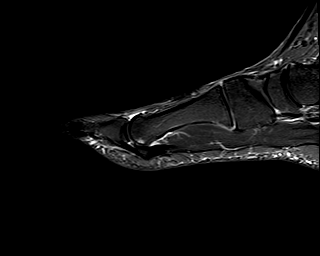
[im 27/27]
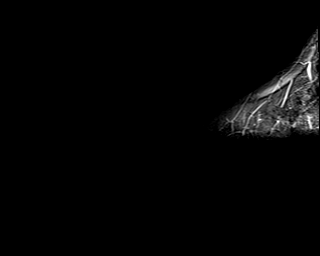

[40 of 40 positions shown; findings below may reference images not displayed]

FINDINGS: Bones/Joint/Cartilage

There is no evidence of fracture. The cortex is intact. There is no
significant marrow signal alteration. There is mild degenerative
change at the medial great toe metatarsal-sesamoid articulation.

Ligaments

Intact Lisfranc ligament.  No evidence of plantar plate tear

Muscles and Tendons

No significant muscle atrophy. No acute tendon tear in the forefoot.

Soft tissues

No evidence of soft tissue mass. No evidence of intermetatarsal
neuroma or bursitis.
IMPRESSION: Mild degenerative arthritis at the medial great toe
metatarsal-sesamoid articulation. Otherwise unremarkable left foot
MRI.

## 2020-08-23 IMAGING — MR MR ANKLE*L* W/O CM
5 series · 40 of 40 positions shown · non-contrast
Comparison: None.

CLINICAL DATA: Left ankle pain

EXAM:
MRI OF THE LEFT ANKLE WITHOUT CONTRAST
TECHNIQUE: Multiplanar, multisequence MR imaging of the ankle was performed. No
intravenous contrast was administered.

[Series 3: PD fat-sat · axial · left · 3.0mm · 0.50mm/px · z∈[-34,+106]mm · 10 of 36 slices shown]
[im 1/36]
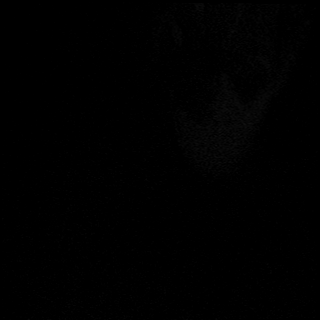
[im 4/36]
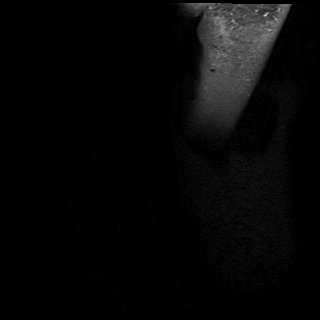
[im 8/36]
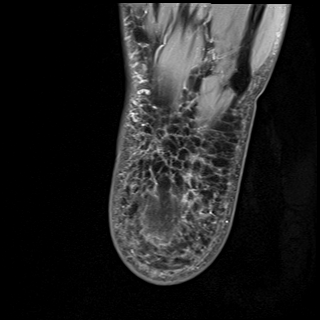
[im 12/36]
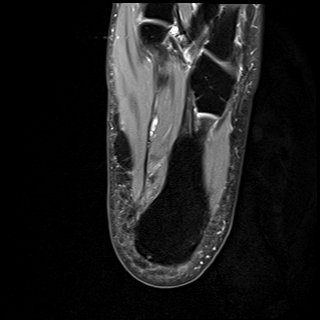
[im 16/36]
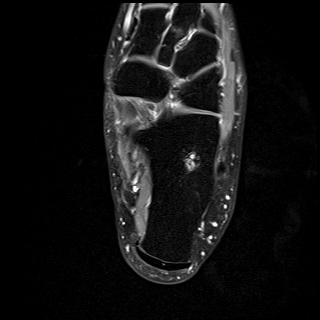
[im 20/36]
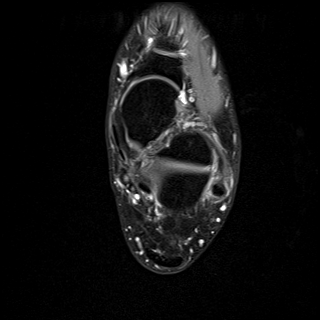
[im 24/36]
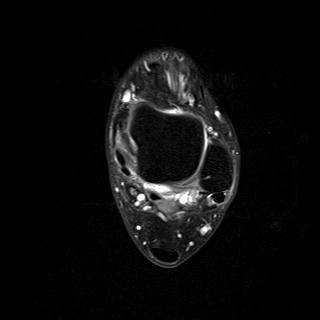
[im 28/36]
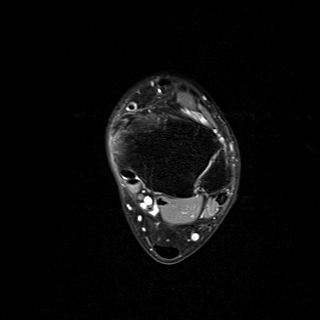
[im 32/36]
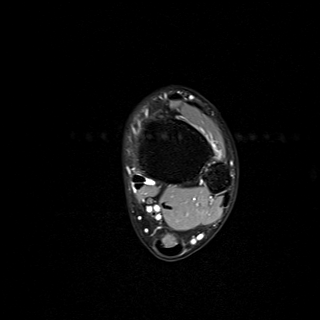
[im 36/36]
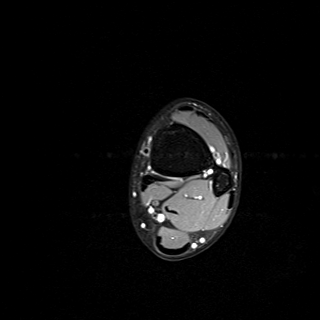

[Series 4: T2 fat-sat · axial · left · 3.0mm · 0.50mm/px · z∈[-34,+106]mm · 10 of 36 slices shown]
[im 1/36]
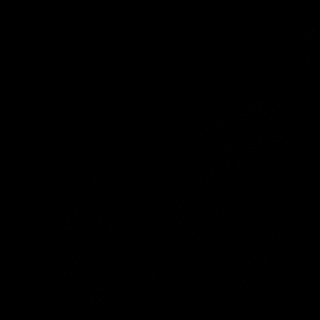
[im 4/36]
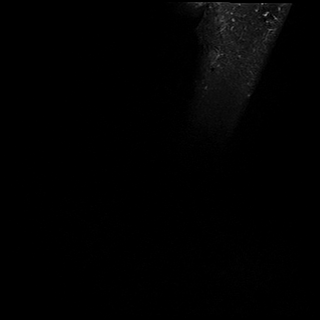
[im 8/36]
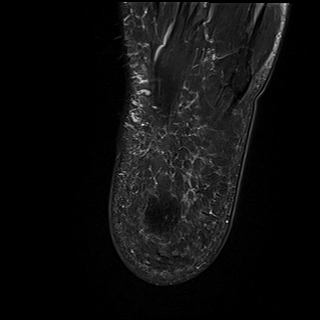
[im 12/36]
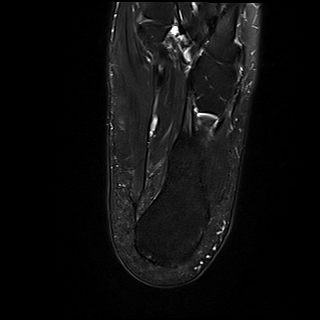
[im 16/36]
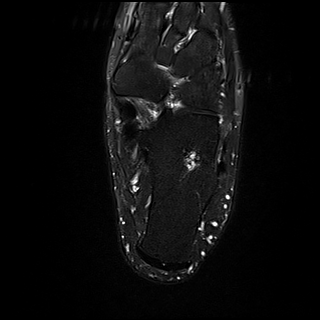
[im 20/36]
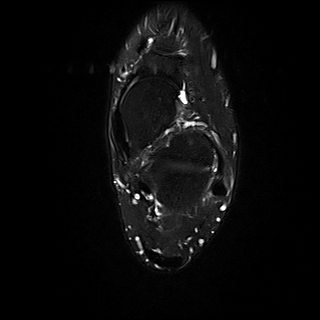
[im 24/36]
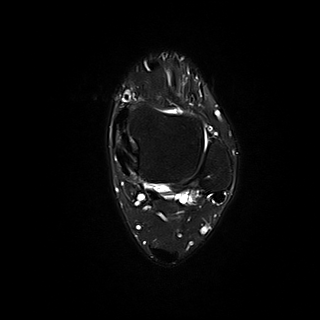
[im 28/36]
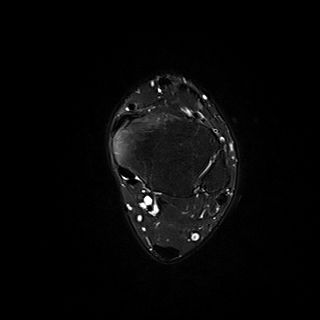
[im 32/36]
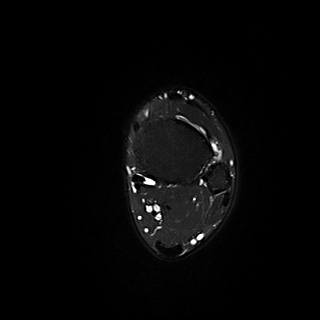
[im 36/36]
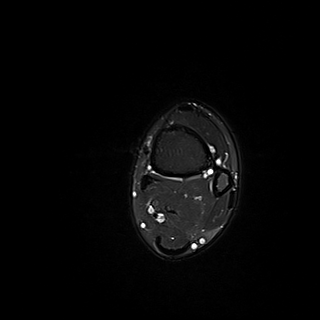

[Series 6: T2 · coronal · left · 3.0mm · 0.62mm/px · 10 of 40 slices shown]
[im 1/40]
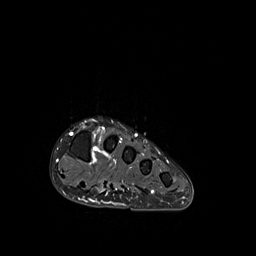
[im 5/40]
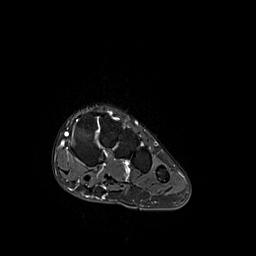
[im 9/40]
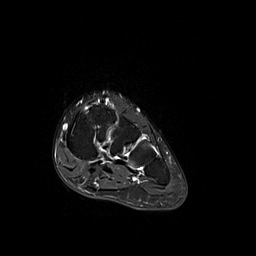
[im 14/40]
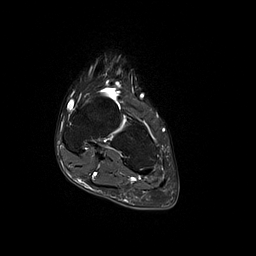
[im 18/40]
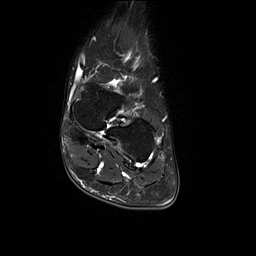
[im 22/40]
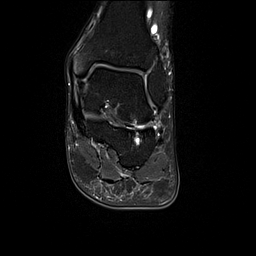
[im 27/40]
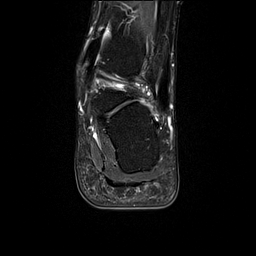
[im 31/40]
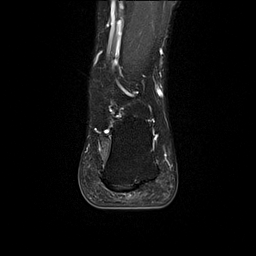
[im 35/40]
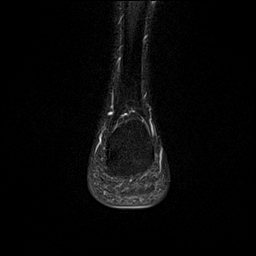
[im 40/40]
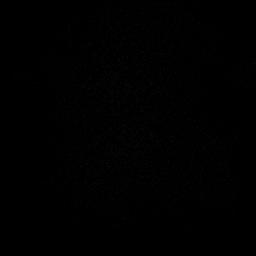

[Series 7: T1 · sagittal · left · 4.0mm · 0.70mm/px · 5 of 21 slices shown]
[im 1/21]
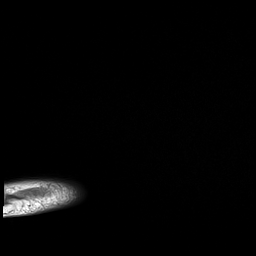
[im 6/21]
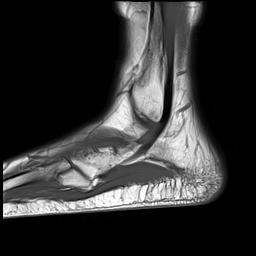
[im 11/21]
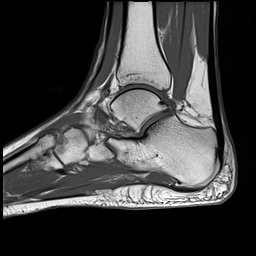
[im 16/21]
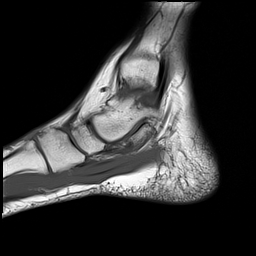
[im 21/21]
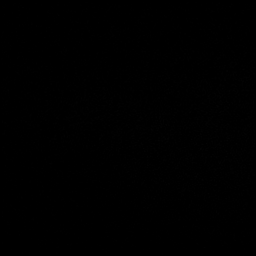

[Series 8: STIR · sagittal · left · 4.0mm · 0.35mm/px · 5 of 21 slices shown]
[im 1/21]
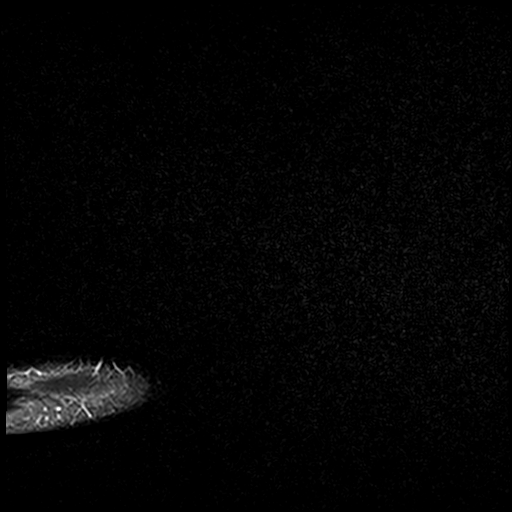
[im 6/21]
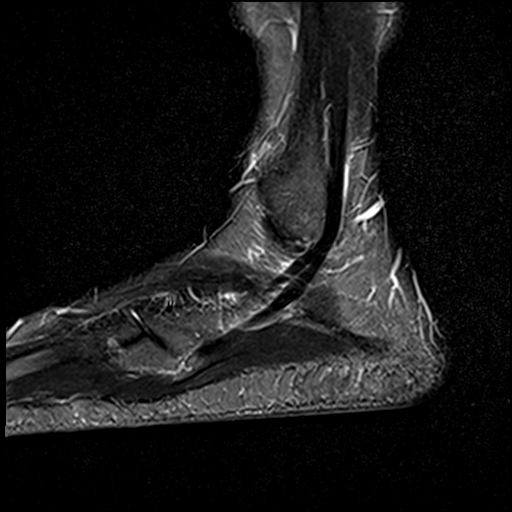
[im 11/21]
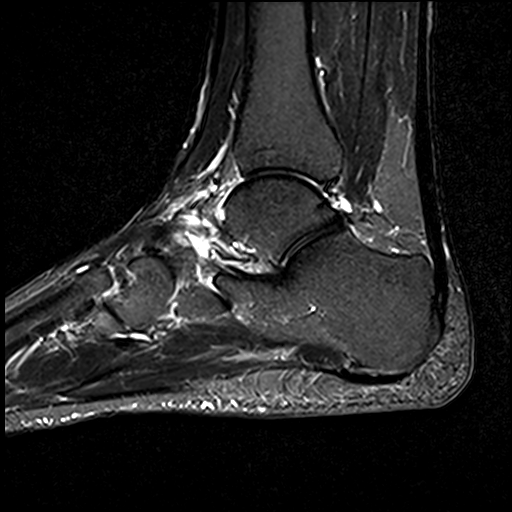
[im 16/21]
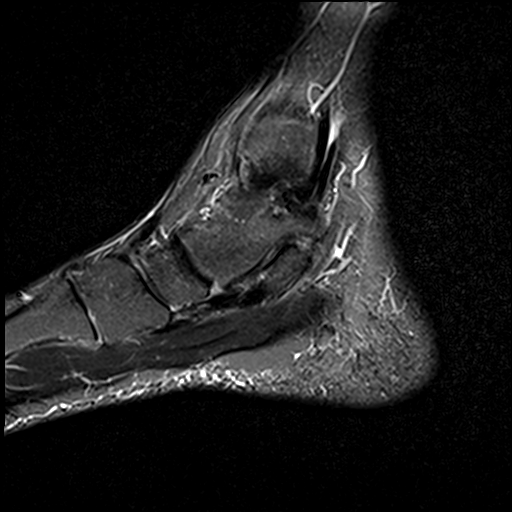
[im 21/21]
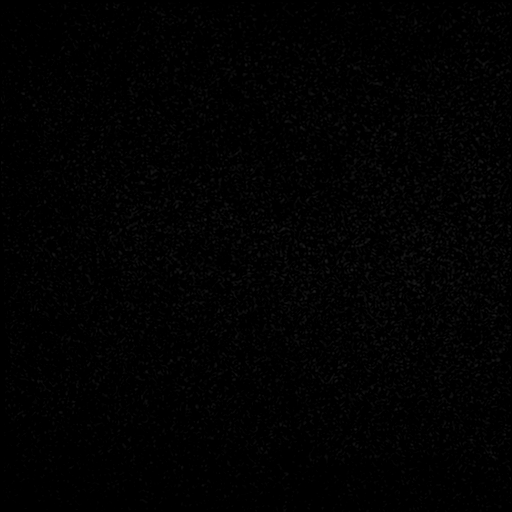

[40 of 40 positions shown; findings below may reference images not displayed]

FINDINGS: TENDONS

Peroneal: Peroneal longus and brevis tendons are intact.

Posteromedial: Mild posterior tibial tendon tenosynovitis above the
malleolus. Flexor digitorum longus tendon intact. Flexor hallucis
longus tendon intact.

Anterior: Tibialis anterior tendon intact. Extensor hallucis longus
tendon intact Extensor digitorum longus tendon intact.

Achilles:  Intact.

Plantar Fascia: Intact. Plantar calcaneal spurring.

LIGAMENTS

Lateral: Anterior talofibular ligament intact. Calcaneofibular
ligament intact. Posterior talofibular ligament intact. Anterior and
posterior tibiofibular ligaments intact.

Medial: Loss of striation with likely scarring along the deep
deltoid compatible with chronic injury/sprain. Spring ligament
intact.

CARTILAGE

Ankle Joint: No joint effusion. Normal ankle mortise. No chondral
defect.

Subtalar Joints/Sinus Tarsi: Normal subtalar joints. No subtalar
joint effusion. Normal sinus tarsi.

Bones: There is bony edema signal in the medial malleolus which is
likely reactive.

Soft Tissue: No fluid collection or hematoma. Muscles are normal
without edema or atrophy. Tarsal tunnel is normal.
IMPRESSION: Loss of striation with likely scarring along the deep deltoid
compatible with chronic injury/sprain.

Mild posterior tibial tendon tenosynovitis above the malleolus.

Reactive marrow edema signal in the medial malleolus.

## 2020-11-02 ENCOUNTER — Encounter: Payer: Self-pay | Admitting: Urology

## 2020-11-02 ENCOUNTER — Ambulatory Visit
Admission: RE | Admit: 2020-11-02 | Discharge: 2020-11-02 | Disposition: A | Discharge status: Home / Self Care | Payer: 59 | Source: Ambulatory Visit | Attending: Urology | Admitting: Urology

## 2020-11-02 ENCOUNTER — Ambulatory Visit: Payer: Self-pay | Admitting: Urology

## 2020-11-02 ENCOUNTER — Ambulatory Visit: Payer: 59 | Admitting: Urology

## 2020-11-02 ENCOUNTER — Other Ambulatory Visit: Payer: Self-pay | Admitting: *Deleted

## 2020-11-02 ENCOUNTER — Ambulatory Visit
Admission: RE | Admit: 2020-11-02 | Discharge: 2020-11-02 | Disposition: A | Discharge status: Home / Self Care | Payer: 59 | Attending: Urology | Admitting: Urology

## 2020-11-02 ENCOUNTER — Other Ambulatory Visit: Payer: Self-pay

## 2020-11-02 VITALS — BP 111/76 | HR 82 | Ht 67.0 in | Wt 194.0 lb

## 2020-11-02 DIAGNOSIS — N200 Calculus of kidney: Secondary | ICD-10-CM

## 2020-11-02 IMAGING — CR DG ABDOMEN 1V
3 series · 3 of 3 positions shown · non-contrast
Comparison: Abdominal radiograph dated [DATE].

CLINICAL DATA: Kidney stone.

EXAM:
ABDOMEN - 1 VIEW

[abdomen kub (1 of 3)]
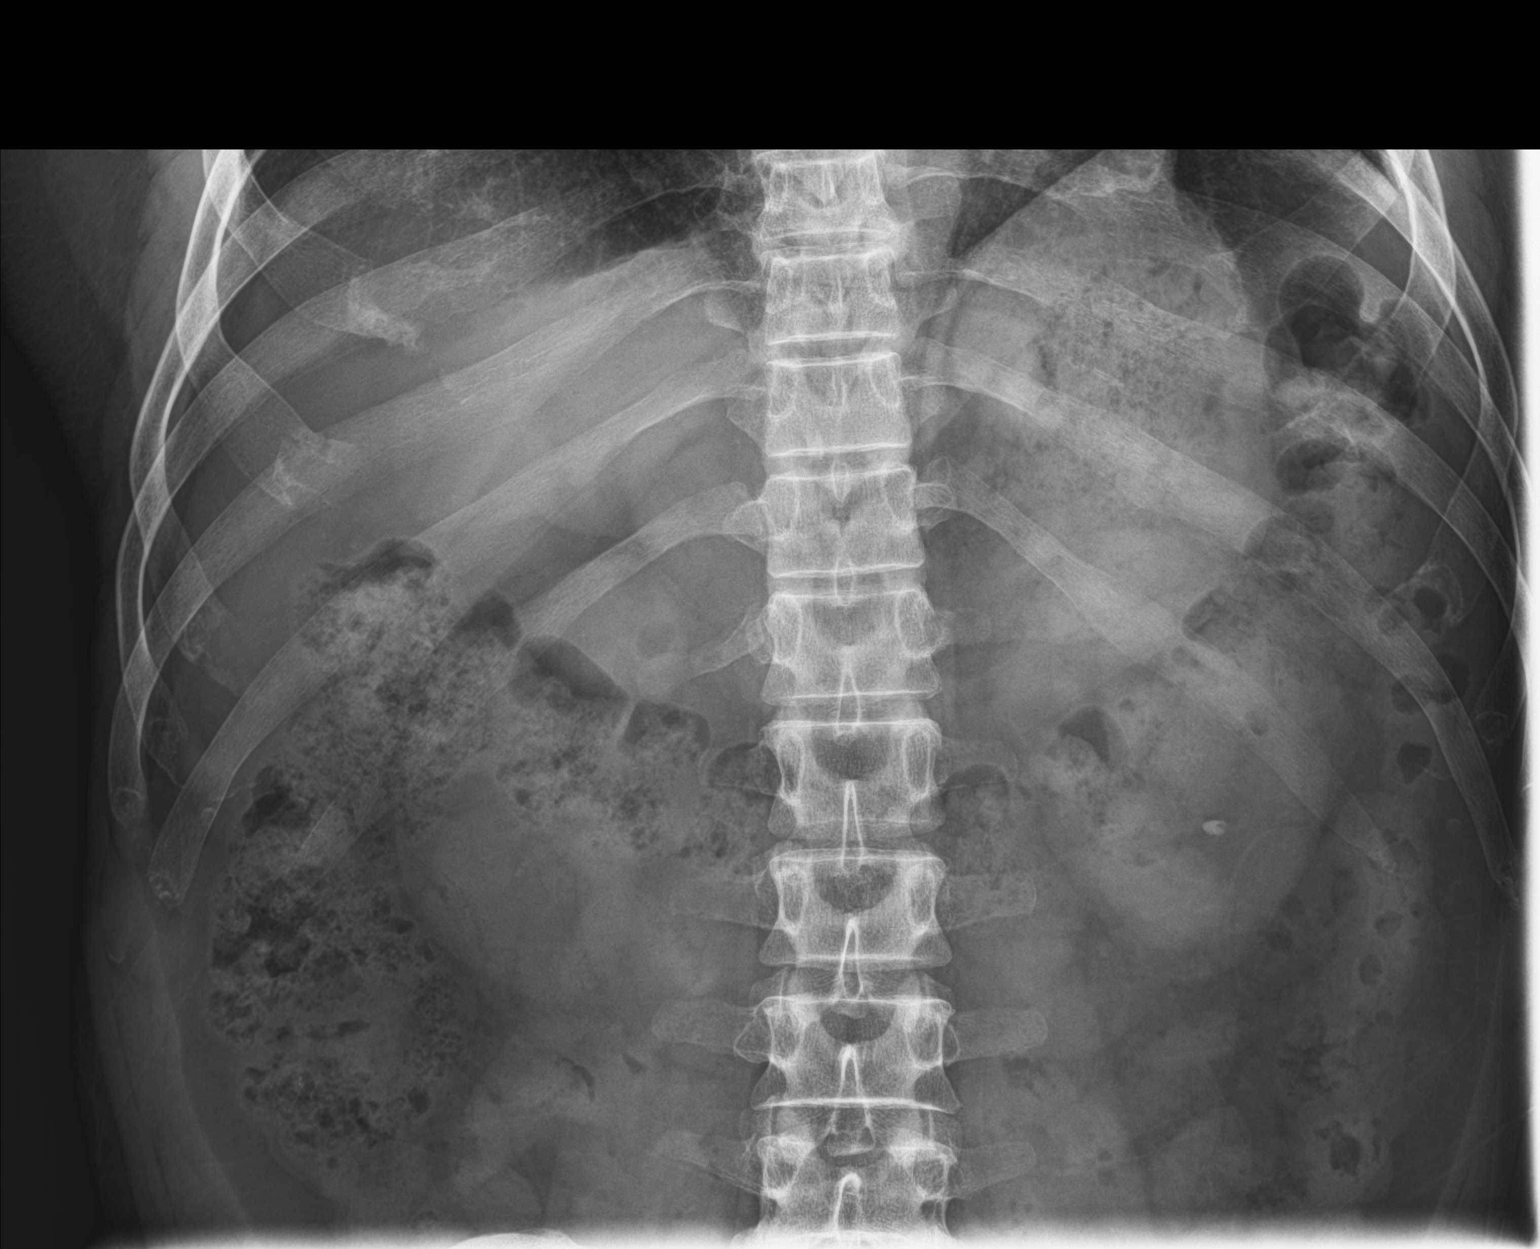

[abdomen kub (2 of 3)]
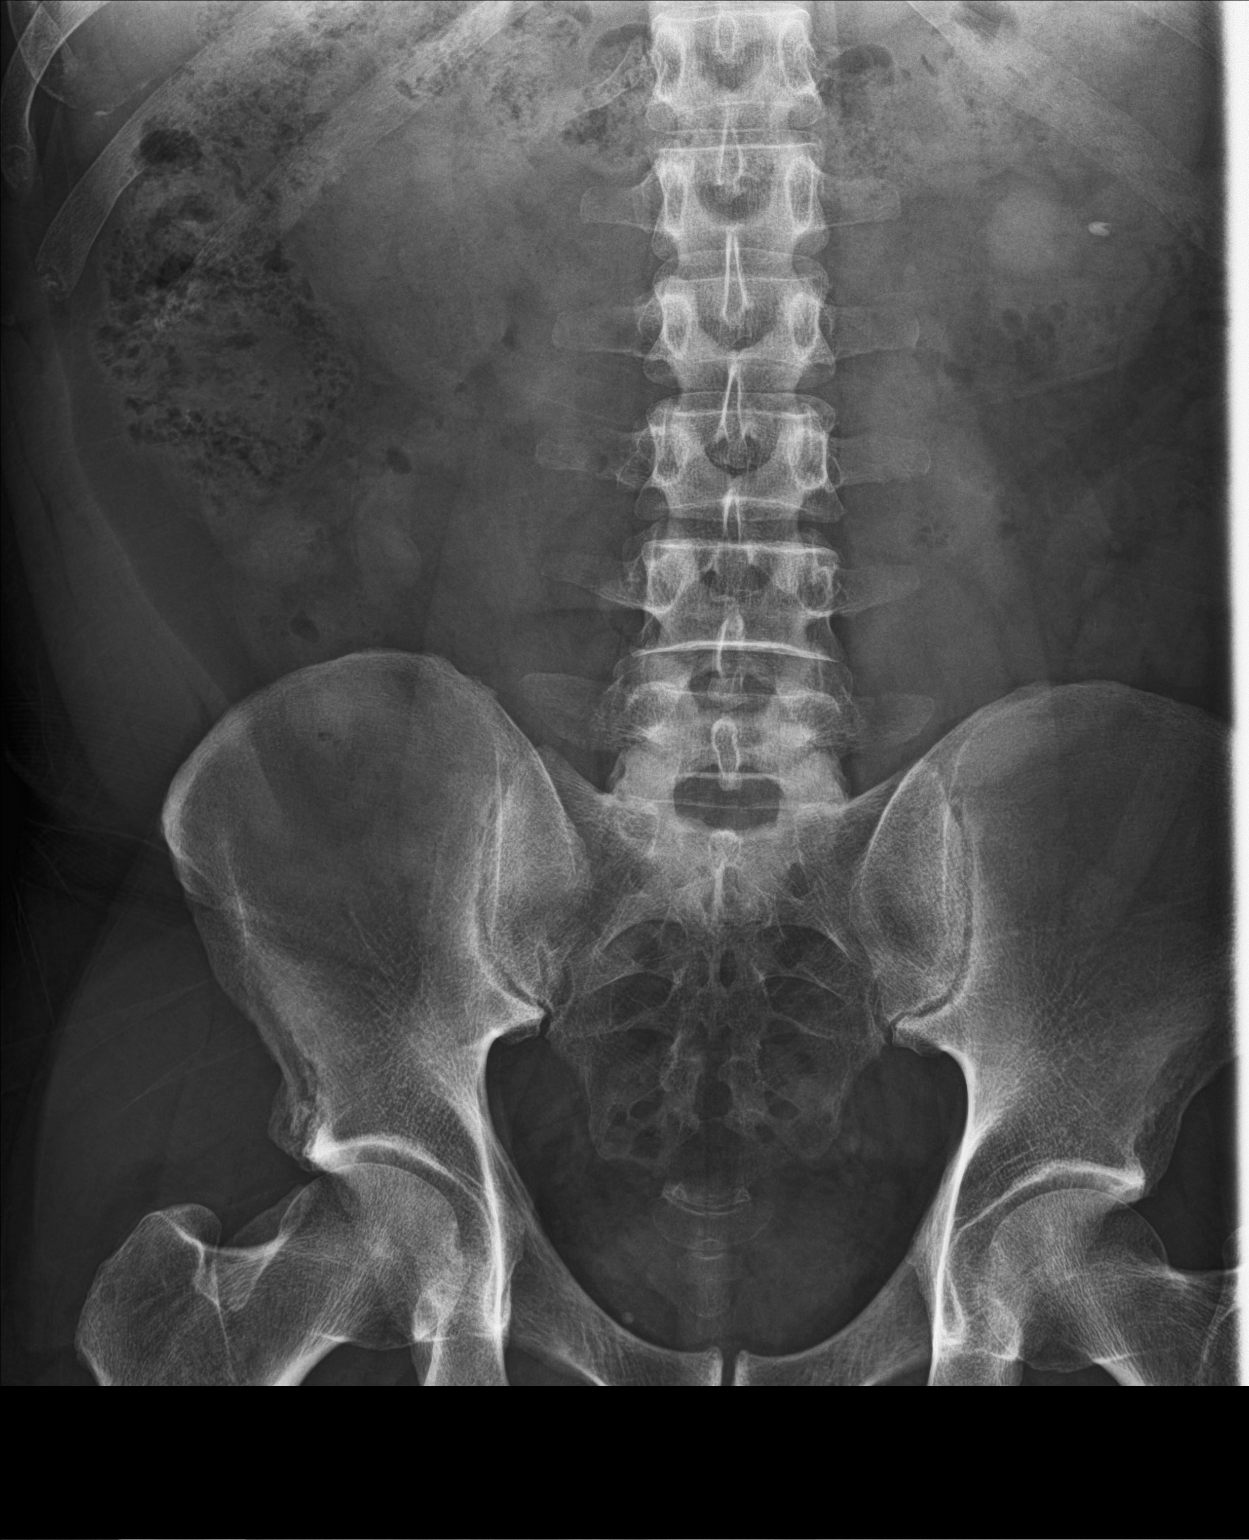

[abdomen kub (3 of 3)]
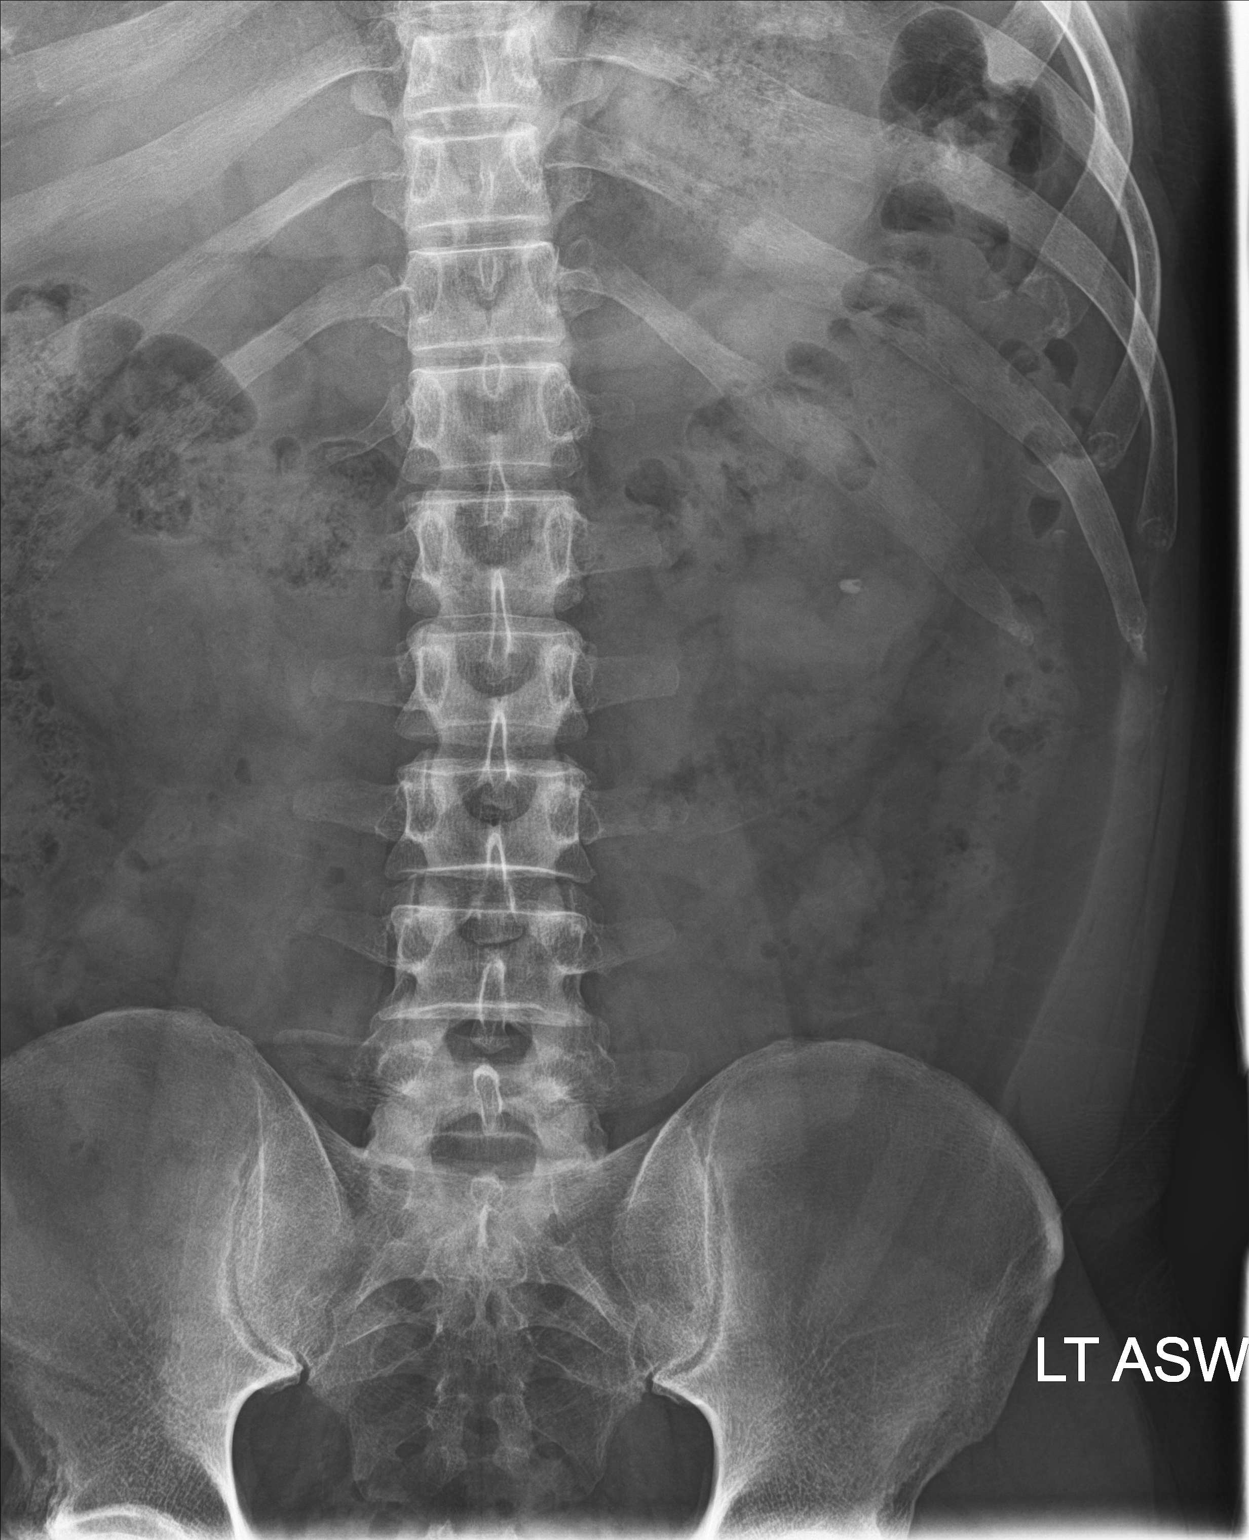

[3 of 3 positions shown; findings below may reference images not displayed]

FINDINGS: There is a 6 mm radiopaque calculus over the inferior pole of the
left kidney. The previously seen dominant stone in the inferior pole
the right kidney is not visualized. Punctate calculus is however
noted in the inferior pole of the right kidney.

No bowel dilatation or evidence of obstruction. Moderate stool
throughout the colon. No free air. The osseous structures are
intact. The soft tissues are unremarkable.
IMPRESSION: Bilateral renal calculi. No bowel obstruction.

## 2020-11-02 NOTE — Progress Notes (Signed)
   11/02/2020 3:13 PM   Tyler Watson 1970/09/17 [id number]  Reason for visit: Follow up nephrolithiasis  HPI: 50 year old healthy male who underwent an uncomplicated right shockwave lithotripsy in January 2020 for a 7 mm proximal ureteral stone.  We have been monitoring bilateral 5 mm lower pole stones with yearly KUB since that time.  He completed a 24-hour urine previously in April 2019 which showed risk factors of low urine volume 1.88 L, very low urine citrate of 197, elevated urine oxalate, and urine pH of 5.5.  He was previously on potassium citrate, but discontinued this medication, as he saw some capsules in his stool, and felt like it was not being absorbed.  We discussed that this does not mean that the medication was not being absorbed, but he deferred any further potassiums citrate therapy.  I personally viewed and interpreted his KUB today that shows a stable 5 mm left lower pole stone, and possible small right lower pole stone, though difficult to see on KUB today.  He thinks he may have passed a stone in December 2021, but denies any problems over the last few months.  He has some issues with constipation and we discussed strategies for this.  We discussed general stone prevention strategies including adequate hydration with goal of producing 2.5 L of urine daily, increasing citric acid intake, increasing calcium intake during high oxalate meals, minimizing animal protein, and decreasing salt intake. Information about dietary recommendations given today.   He would like to continue yearly KUB surveillance, return precautions discussed at length    Sondra Come, MD  [facility] [address], [address] [place], [place] [address] [phone]

## 2020-11-02 NOTE — Patient Instructions (Addendum)
Dietary Guidelines to Help Prevent Kidney Stones Kidney stones are deposits of minerals and salts that form inside your kidneys. Your risk of developing kidney stones may be greater depending on your diet, your lifestyle, the medicines you take, and whether you have certain medical conditions. Most people can lower their chances of developing kidney stones by following the instructions below. Your dietitian may give you more specific instructions depending on your overall health and the type of kidney stones you tend to develop. What are tips for following this plan? Reading food labels  Choose foods with "no salt added" or "low-salt" labels. Limit your salt (sodium) intake to less than 1,500 mg a day. Choose foods with calcium for each meal and snack. Try to eat about 300 mg of calcium at each meal. Foods that contain 200-500 mg of calcium a serving include: 8 oz (237 mL) of milk, calcium-fortifiednon-dairy milk, and calcium-fortifiedfruit juice. Calcium-fortified means that calcium has been added to these drinks. 8 oz (237 mL) of kefir, yogurt, and soy yogurt. 4 oz (114 g) of tofu. 1 oz (28 g) of cheese. 1 cup (150 g) of dried figs. 1 cup (91 g) of cooked broccoli. One 3 oz (85 g) can of sardines or mackerel. Most people need 1,000-1,500 mg of calcium a day. Talk to your dietitian about how much calcium is recommended for you. Shopping Buy plenty of fresh fruits and vegetables. Most people do not need to avoid fruits and vegetables, even if these foods contain nutrients that may contribute to kidney stones. When shopping for convenience foods, choose: Whole pieces of fruit. Pre-made salads with dressing on the side. Low-fat fruit and yogurt smoothies. Avoid buying frozen meals or prepared deli foods. These can be high in sodium. Look for foods with live cultures, such as yogurt and kefir. Choose high-fiber grains, such as whole-wheat breads, oat bran, and wheat cereals. Cooking Do not add  salt to food when cooking. Place a salt shaker on the table and allow each person to add his or her own salt to taste. Use vegetable protein, such as beans, textured vegetable protein (TVP), or tofu, instead of meat in pasta, casseroles, and soups. Meal planning Eat less salt, if told by your dietitian. To do this: Avoid eating processed or pre-made food. Avoid eating fast food. Eat less animal protein, including cheese, meat, poultry, or fish, if told by your dietitian. To do this: Limit the number of times you have meat, poultry, fish, or cheese each week. Eat a diet free of meat at least 2 days a week. Eat only one serving each day of meat, poultry, fish, or seafood. When you prepare animal protein, cut pieces into small portion sizes. For most meat and fish, one serving is about the size of the palm of your hand. Eat at least five servings of fresh fruits and vegetables each day. To do this: Keep fruits and vegetables on hand for snacks. Eat one piece of fruit or a handful of berries with breakfast. Have a salad and fruit at lunch. Have two kinds of vegetables at dinner. Limit foods that are high in a substance called oxalate. These include: Spinach (cooked), rhubarb, beets, sweet potatoes, and Swiss chard. Peanuts. Potato chips, french fries, and baked potatoes with skin on. Nuts and nut products. Chocolate. If you regularly take a diuretic medicine, make sure to eat at least 1 or 2 servings of fruits or vegetables that are high in potassium each day. These include: Avocado. Banana. Orange, prune,   carrot, or tomato juice. Baked potato. Cabbage. Beans and split peas. Lifestyle  Drink enough fluid to keep your urine pale yellow. This is the most important thing you can do. Spread your fluid intake throughout the day. If you drink alcohol: Limit how much you use to: 0-1 drink a day for women who are not pregnant. 0-2 drinks a day for men. Be aware of how much alcohol is in your  drink. In the [place] one drink equals one 12 oz bottle of beer (355 mL), one 5 oz glass of wine (148 mL), or one 1 oz glass of hard liquor (44 mL). Lose weight if told by your health care provider. Work with your dietitian to find an eating plan and weight loss strategies that work best for you. General information Talk to your health care provider and dietitian about taking daily supplements. You may be told the following depending on your health and the cause of your kidney stones: Not to take supplements with vitamin C. To take a calcium supplement. To take a daily probiotic supplement. To take other supplements such as magnesium, fish oil, or vitamin B6. Take over-the-counter and prescription medicines only as told by your health care provider. These include supplements. What foods should I limit? Limit your intake of the following foods, or eat them as told by your dietitian. Vegetables Spinach. Rhubarb. Beets. Canned vegetables. Rosita Fire. Olives. Baked potatoes with skin. Grains Wheat bran. Baked goods. Salted crackers. Cereals high in sugar. Meats and other proteins Nuts. Nut butters. Large portions of meat, poultry, or fish. Salted, precooked, or cured meats, such as sausages, meat loaves, and hot dogs. Dairy Cheese. Beverages Regular soft drinks. Regular vegetable juice. Seasonings and condiments Seasoning blends with salt. Salad dressings. Soy sauce. Ketchup. Barbecue sauce. Other foods Canned soups. Canned pasta sauce. Casseroles. Pizza. Lasagna. Frozen meals. Potato chips. [place] fries. The items listed above may not be a complete list of foods and beverages you should limit. Contact a dietitian for more information. What foods should I avoid? Talk to your dietitian about specific foods you should avoid based on the type of kidney stones you have and your overall health. Fruits Grapefruit. The item listed above may not be a complete list of foods and beverages you should  avoid. Contact a dietitian for more information. Summary Kidney stones are deposits of minerals and salts that form inside your kidneys. You can lower your risk of kidney stones by making changes to your diet. The most important thing you can do is drink enough fluid. Drink enough fluid to keep your urine pale yellow. Talk to your dietitian about how much calcium you should have each day, and eat less salt and animal protein as told by your dietitian. This information is not intended to replace advice given to you by your health care provider. Make sure you discuss any questions you have with your health care provider. Document Revised: 12/19/2018 Document Reviewed: 12/19/2018 Elsevier Patient Education  2022 Elsevier Inc.   Constipation, Adult  Recommend MiraLAX or docusate over-the-counter if you are having problems with constipation.  Constipation is when a person has fewer than three bowel movements in a week, has difficulty having a bowel movement, or has stools (feces) that are dry, hard, or larger than normal. Constipation may be caused by an underlying condition. It may become worse with age if a person takes certain medicines and does not take in enough fluids. Follow these instructions at home: Eating and drinking  Eat foods  that have a lot of fiber, such as beans, whole grains, and fresh fruits and vegetables. Limit foods that are low in fiber and high in fat and processed sugars, such as fried or sweet foods. These include french fries, hamburgers, cookies, candies, and soda. Drink enough fluid to keep your urine pale yellow. General instructions Exercise regularly or as told by your health care provider. Try to do 150 minutes of moderate exercise each week. Use the bathroom when you have the urge to go. Do not hold it in. Take over-the-counter and prescription medicines only as told by your health care provider. This includes any fiber supplements. During bowel  movements: Practice deep breathing while relaxing the lower abdomen. Practice pelvic floor relaxation. Watch your condition for any changes. Let your health care provider know about them. Keep all follow-up visits as told by your health care provider. This is important. Contact a health care provider if: You have pain that gets worse. You have a fever. You do not have a bowel movement after 4 days. You vomit. You are not hungry or you lose weight. You are bleeding from the opening between the buttocks (anus). You have thin, pencil-like stools. Get help right away if: You have a fever and your symptoms suddenly get worse. You leak stool or have blood in your stool. Your abdomen is bloated. You have severe pain in your abdomen. You feel dizzy or you faint. Summary Constipation is when a person has fewer than three bowel movements in a week, has difficulty having a bowel movement, or has stools (feces) that are dry, hard, or larger than normal. Eat foods that have a lot of fiber, such as beans, whole grains, and fresh fruits and vegetables. Drink enough fluid to keep your urine pale yellow. Take over-the-counter and prescription medicines only as told by your health care provider. This includes any fiber supplements. This information is not intended to replace advice given to you by your health care provider. Make sure you discuss any questions you have with your health care provider. Document Revised: 11/13/2018 Document Reviewed: 11/13/2018 Elsevier Patient Education  2022 [employer].

## 2021-06-13 ENCOUNTER — Other Ambulatory Visit
Admission: RE | Admit: 2021-06-13 | Discharge: 2021-06-13 | Disposition: A | Discharge status: Home / Self Care | Payer: 59 | Source: Home / Self Care | Attending: Urology | Admitting: Urology

## 2021-06-13 ENCOUNTER — Other Ambulatory Visit: Payer: Self-pay | Admitting: Family Medicine

## 2021-06-13 ENCOUNTER — Encounter: Payer: Self-pay | Admitting: Urology

## 2021-06-13 ENCOUNTER — Ambulatory Visit
Admission: RE | Admit: 2021-06-13 | Discharge: 2021-06-13 | Disposition: A | Discharge status: Home / Self Care | Payer: 59 | Source: Ambulatory Visit | Attending: Urology | Admitting: Urology

## 2021-06-13 ENCOUNTER — Other Ambulatory Visit: Payer: Self-pay

## 2021-06-13 ENCOUNTER — Ambulatory Visit: Payer: 59 | Admitting: Urology

## 2021-06-13 ENCOUNTER — Ambulatory Visit
Admission: RE | Admit: 2021-06-13 | Discharge: 2021-06-13 | Disposition: A | Discharge status: Home / Self Care | Payer: 59 | Attending: Urology | Admitting: Urology

## 2021-06-13 ENCOUNTER — Telehealth: Payer: Self-pay | Admitting: Family Medicine

## 2021-06-13 VITALS — BP 124/85 | HR 96 | Temp 99.1°F | Ht 67.0 in | Wt 198.0 lb

## 2021-06-13 DIAGNOSIS — N200 Calculus of kidney: Secondary | ICD-10-CM | POA: Diagnosis not present

## 2021-06-13 DIAGNOSIS — N419 Inflammatory disease of prostate, unspecified: Secondary | ICD-10-CM | POA: Diagnosis not present

## 2021-06-13 DIAGNOSIS — Z113 Encounter for screening for infections with a predominantly sexual mode of transmission: Secondary | ICD-10-CM

## 2021-06-13 LAB — CHLAMYDIA/NGC RT PCR (ARMC ONLY)
Chlamydia Tr: NOT DETECTED
N gonorrhoeae: NOT DETECTED

## 2021-06-13 LAB — URINALYSIS, COMPLETE (UACMP) WITH MICROSCOPIC
Bacteria, UA: NONE SEEN
Bilirubin Urine: NEGATIVE
Glucose, UA: NEGATIVE mg/dL
Hgb urine dipstick: NEGATIVE
Ketones, ur: NEGATIVE mg/dL
Leukocytes,Ua: NEGATIVE
Nitrite: NEGATIVE
Protein, ur: NEGATIVE mg/dL
RBC / HPF: NONE SEEN RBC/hpf (ref 0–5)
Specific Gravity, Urine: 1.01 (ref 1.005–1.030)
Squamous Epithelial / HPF: NONE SEEN (ref 0–5)
WBC, UA: NONE SEEN WBC/hpf (ref 0–5)
pH: 6.5 (ref 5.0–8.0)

## 2021-06-13 IMAGING — CR DG ABDOMEN 1V
2 series · 2 of 2 positions shown · non-contrast
Comparison: KUB [DATE]

CLINICAL DATA: History of kidney stones. Pain 1.5 weeks left side
and near bladder.

EXAM:
ABDOMEN - 1 VIEW

[abdomen kub (1 of 2)]
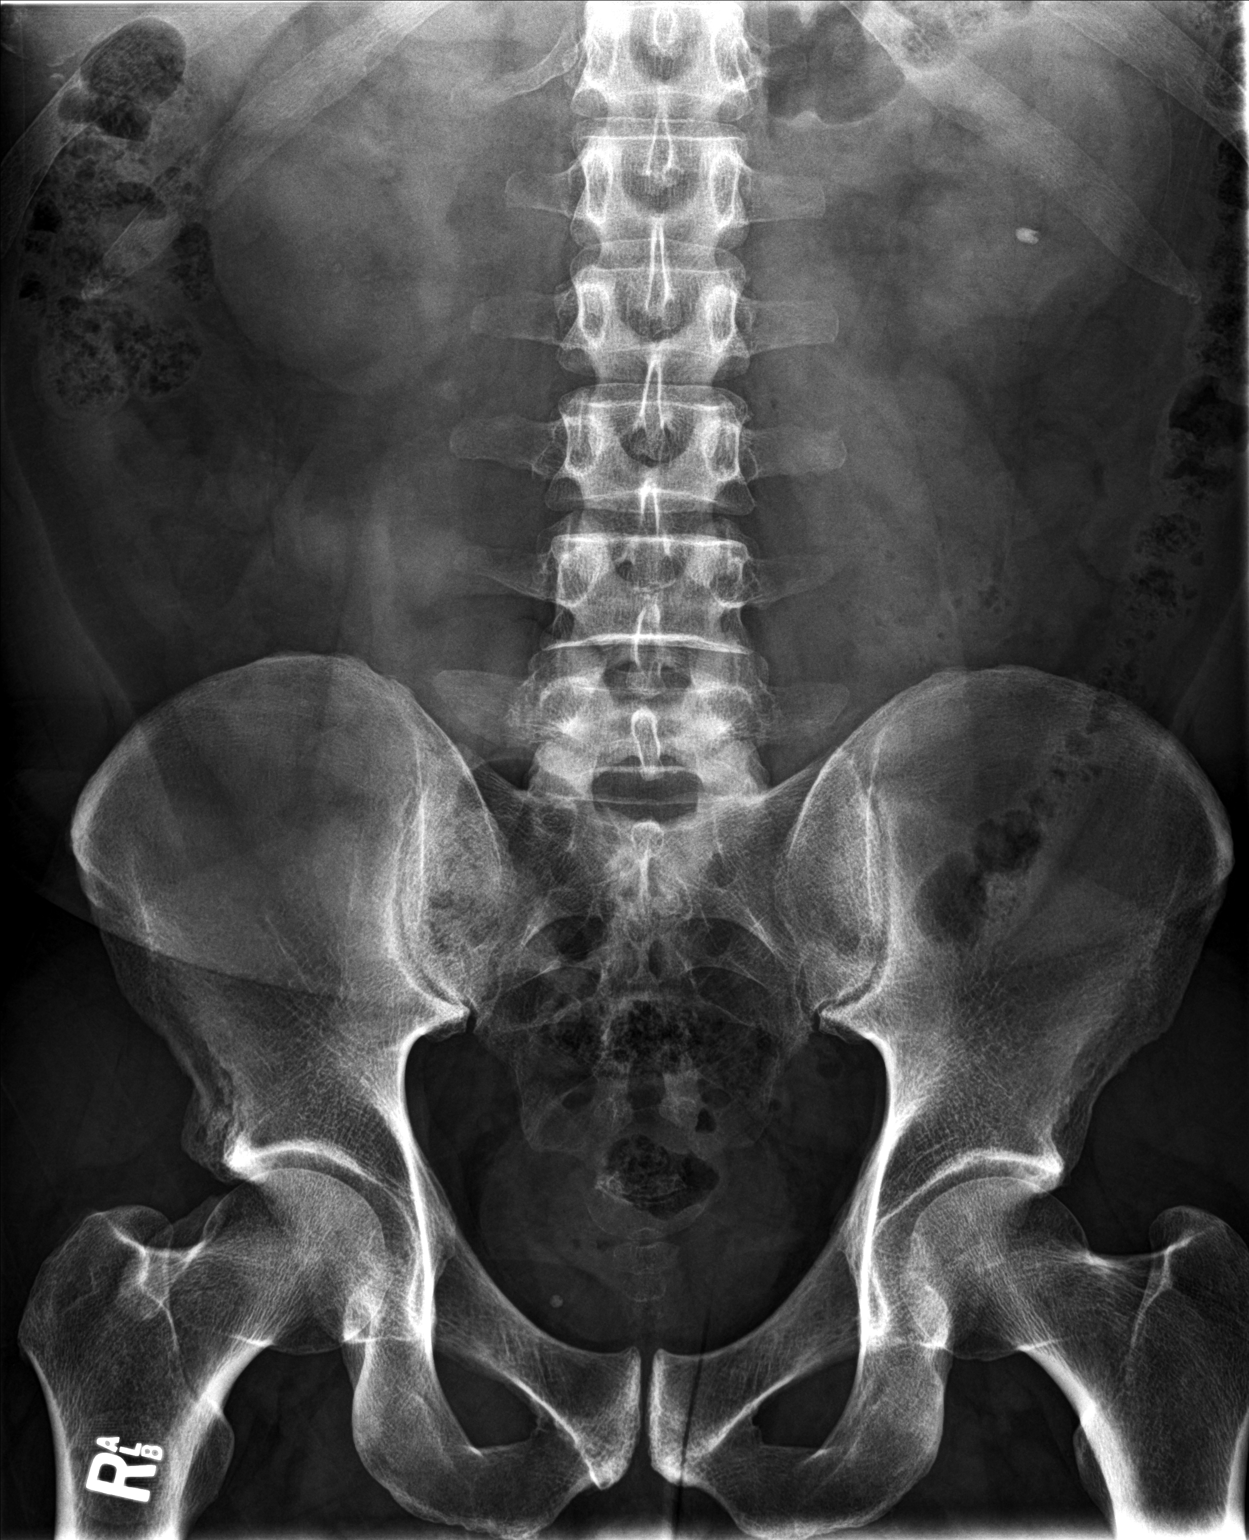

[abdomen kub (2 of 2)]
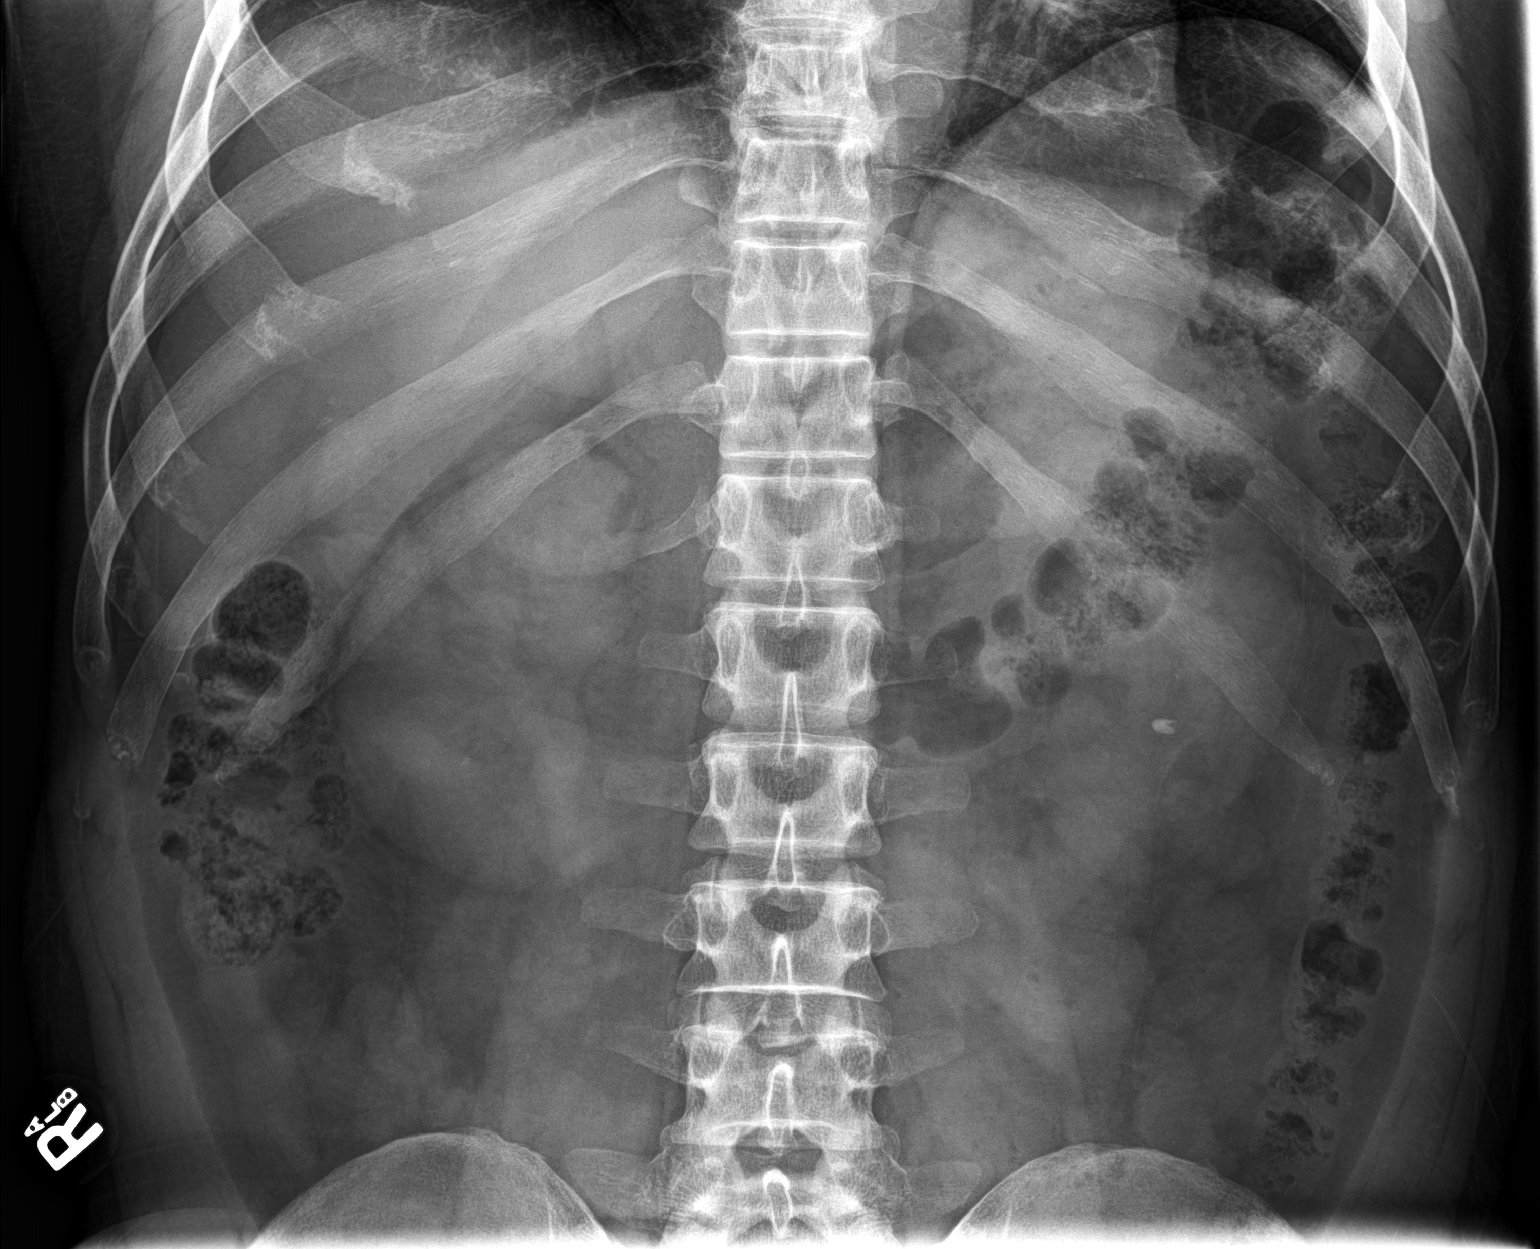

[2 of 2 positions shown; findings below may reference images not displayed]

FINDINGS: Redemonstration of 6 mm calcific density overlying the lower pole of
the left kidney. Redemonstration of a punctate approximate 2-3 mm
calcification overlying the lower pole of the right kidney. No
definite new renal stone is identified. Bowel gas and stool obscure
portions of the superior aspect of each kidney.

No definite stone is seen along the expected course of either
ureter. A vascular phlebolith overlies the right hemipelvis,
unchanged from [DATE] CT abdomen and pelvis.

Mild-to-moderate stool throughout the colon. Nonobstructed bowel-gas
pattern.

Minimal superior pubic symphysis degenerative osteophytosis. The
bilateral femoroacetabular and bilateral sacroiliac joint spaces are
maintained.
IMPRESSION: Unchanged left-greater-than-right single renal calculi, similar to
prior.

## 2021-06-13 MED ORDER — SULFAMETHOXAZOLE-TRIMETHOPRIM 800-160 MG PO TABS: 1.0000 | ORAL_TABLET | Freq: Two times a day (BID) | ORAL | 0 refills | Status: DC

## 2021-06-13 NOTE — Addendum Note (Signed)
Addended by: Veneta Penton on: 06/13/2021 10:01 AM   Modules accepted: Orders

## 2021-06-13 NOTE — Progress Notes (Addendum)
06/13/21 9:36 PM   Tyler Watson June 26, 1970 [id number]  Referring provider:  [facility] [address] [place],  [place] [address]  Urological history  Nephrolithiasis  - S/p ESWL in 01/2021 for a 7 mm proximal ureteral stone.  -He completed a 24-hour urine previously in April 2019 which showed risk factors of low urine volume 1.88 L, very low urine citrate of 197, elevated urine oxalate, and urine pH of 5.5.  He was previously on potassium citrate, but discontinued this medication - KUB on 11/02/2020 visualized bilateral renal calculi.  - KUB, 06/2021 - bilateral single calculi   Chief Complaint  Patient presents with   Nephrolithiasis    HPI: Tyler Watson is a 51 y.o.male who presents today for further evaluation of kidney stone symptoms.   He reports today when he has sexual intercourse, he experiences burning when ejaculating that has been ongoing for a few months. He denies blood in ejaculatory fluid. He denies burning with urination. He is in a monogamous relationship and he is not concerned for an STD. He reports that 4-5 years ago he saw Dr Sharma Covert had the same symptoms and was found to have prostatitis.    He is also having issues with frequent bowel movements and left lower quadrant pain.  UA negative   KUB possible right ureteral stone, 4 mm faint radiopaque area above L3 on KUB # 1   PMH: Past Medical History:  Diagnosis Date   Kidney stones     Surgical History: Past Surgical History:  Procedure Laterality Date   EXTRACORPOREAL SHOCK WAVE LITHOTRIPSY Right 01/17/2018   Procedure: EXTRACORPOREAL SHOCK WAVE LITHOTRIPSY (ESWL);  Surgeon: Vanna Scotland, MD;  Location: [facility] ORS;  Service: Urology;  Laterality: Right;   LITHOTRIPSY      Home Medications:  Allergies as of 06/13/2021   No Known Allergies      Medication List        Accurate as of June 13, 2021  9:36 PM. If you have any questions, ask your nurse or doctor.           sulfamethoxazole-trimethoprim 800-160 MG tablet Commonly known as: BACTRIM DS Take 1 tablet by mouth every 12 (twelve) hours. Started by: Michiel Cowboy, PA-C        Allergies:  No Known Allergies  Family History: Family History  Problem Relation Age of Onset   Hypertension Mother    Heart attack Father 53    Social History:  reports that he has never smoked. He has never used smokeless tobacco. He reports that he does not currently use alcohol. He reports that he does not use drugs.   Physical Exam: BP 124/85   Pulse 96   Temp 99.1 F (37.3 C)   Ht 5\' 7"  (1.702 m)   Wt 198 lb (89.8 kg)   BMI 31.01 kg/m   Constitutional:  Alert and oriented, No acute distress. HEENT: [place] AT, moist mucus membranes.  Trachea midline, no masses. Cardiovascular: No clubbing, cyanosis, or edema. Respiratory: Normal respiratory effort, no increased work of breathing. GU:  No CVA tenderness.  No bladder fullness or masses.  Patient with circumcised phallus. Foreskin easily retracted  Urethral meatus is patent.  No penile discharge. No penile lesions or rashes. Scrotum without lesions, cysts, rashes and/or edema.  Bilateral hydroceles, testicles are located scrotally bilaterally. No masses are appreciated in the testicles. Left and right epididymis are normal. Rectal: Patient with  normal sphincter tone. Anus and perineum without  scarring or rashes. No rectal masses are appreciated. Prostate is approximately 50 grams, could only palpate the apex was tender to palpation, no nodules are appreciated. Seminal vesicles could not be palpated Neurologic: Grossly intact, no focal deficits, moving all 4 extremities. Psychiatric: Normal mood and affect.  Laboratory Data: Urinalysis Results for orders placed or performed during the hospital encounter of 06/13/21  Urinalysis, Complete w Microscopic  Result Value Ref Range   Color, Urine STRAW (A) YELLOW   APPearance CLEAR CLEAR   Specific Gravity,  Urine 1.010 1.005 - 1.030   pH 6.5 5.0 - 8.0   Glucose, UA NEGATIVE NEGATIVE mg/dL   Hgb urine dipstick NEGATIVE NEGATIVE   Bilirubin Urine NEGATIVE NEGATIVE   Ketones, ur NEGATIVE NEGATIVE mg/dL   Protein, ur NEGATIVE NEGATIVE mg/dL   Nitrite NEGATIVE NEGATIVE   Leukocytes,Ua NEGATIVE NEGATIVE   Squamous Epithelial / LPF NONE SEEN 0 - 5   WBC, UA NONE SEEN 0 - 5 WBC/hpf   RBC / HPF NONE SEEN 0 - 5 RBC/hpf   Bacteria, UA NONE SEEN NONE SEEN  I have reviewed the labs.   Pertinent Imaging ? Right ureteral stone, but patient with negative UA, tender prostate and LLQ pain I have independently reviewed the films.  See HPI.  Radiologist's interpretation still pending.  Assessment & Plan:   Prostatitis vs diverticulitis - UA benign today  - DRE was tender to palpation  - Will send for GC probe amplification  -will sent urine for culture - Septra DS x2 daily for 14 days; prescribed  -Advised patient that he will need a screening colonoscopy at some point as he is at age 75  2.  Nephrolithiasis -?  Right ureteral stone, patient is asymptomatic on the right side with negative UA, so will hold on CT scan at this time and treat for prostatitis/diverticulitis -We will continue to monitor  Return in about 2 weeks (around 06/27/2021) for symptom recheck .  [facility] [address], [address] [place], [place] [address] [phone]  I, Clinton Sawyer Littlejohn,acting as a scribe for [facility], PA-C.,have documented all relevant documentation on the behalf of Tyler Pavone, PA-C,as directed by  [facility], PA-C while in the presence of Natashia Roseman, PA-C.  I have reviewed the above documentation for accuracy and completeness, and I agree with the above.    Michiel Cowboy, PA-C

## 2021-06-15 LAB — URINE CULTURE: Culture: NO GROWTH

## 2021-06-15 NOTE — Telephone Encounter (Signed)
error 

## 2021-06-27 ENCOUNTER — Ambulatory Visit: Payer: 59 | Admitting: Urology

## 2021-06-27 NOTE — Progress Notes (Incomplete)
06/27/21 6:27 AM   Tyler Watson February 02, 1970 [id number]  Referring provider:  [facility] [address] [place],  [place] [address] No chief complaint on file.   Urological history  Nephrolithiasis  - S/p ESWL in 01/2021 for a 7 mm proximal ureteral stone.  -He completed a 24-hour urine previously in April 2019 which showed risk factors of low urine volume 1.88 L, very low urine citrate of 197, elevated urine oxalate, and urine pH of 5.5.  He was previously on potassium citrate, but discontinued this medication - KUB on 11/02/2020 visualized bilateral renal calculi.  - KUB on 6/06/202 showed unchanged left-greater-than- right single renal calculi.   2. Prostatitis vs. Diverticulitis  - GC probe amplification negative on  - UA today    HPI: Tyler Watson is a 51 y.o.male who presents today for a 2 week follow-up and symptom recheck.       PMH: Past Medical History:  Diagnosis Date   Kidney stones     Surgical History: Past Surgical History:  Procedure Laterality Date   EXTRACORPOREAL SHOCK WAVE LITHOTRIPSY Right 01/17/2018   Procedure: EXTRACORPOREAL SHOCK WAVE LITHOTRIPSY (ESWL);  Surgeon: Vanna Scotland, MD;  Location: [facility] ORS;  Service: Urology;  Laterality: Right;   LITHOTRIPSY      Home Medications:  Allergies as of 06/27/2021   No Known Allergies      Medication List        Accurate as of June 27, 2021  6:27 AM. If you have any questions, ask your nurse or doctor.          sulfamethoxazole-trimethoprim 800-160 MG tablet Commonly known as: BACTRIM DS Take 1 tablet by mouth every 12 (twelve) hours.        Allergies:  No Known Allergies  Family History: Family History  Problem Relation Age of Onset   Hypertension Mother    Heart attack Father 59    Social History:  reports that he has never smoked. He has never used smokeless tobacco. He reports that he does not currently use alcohol. He reports that he does not use  drugs.   Physical Exam: There were no vitals taken for this visit.  Constitutional:  Alert and oriented, No acute distress. HEENT: [place] AT, moist mucus membranes.  Trachea midline, no masses. Cardiovascular: No clubbing, cyanosis, or edema. Respiratory: Normal respiratory effort, no increased work of breathing. GI: Abdomen is soft, nontender, nondistended, no abdominal masses GU: No CVA tenderness.  No bladder fullness or masses.  Patient with uncircumcised phallus. Foreskin easily retracted  Urethral meatus is patent.  No penile discharge. No penile lesions or rashes. Scrotum without lesions, cysts, rashes and/or edema.  Bilateral hydroceles, testicles are located scrotally bilaterally. No masses are appreciated in the testicles. Left and right epididymis are normal. Lymph: No cervical or inguinal lymphadenopathy. Skin: No rashes, bruises or suspicious lesions. Neurologic: Grossly intact, no focal deficits, moving all 4 extremities. Psychiatric: Normal mood and affect.   Laboratory Data:  Lab Results  Component Value Date   CREATININE 1.24 01/08/2018     No results found for: "HGBA1C"  Urinalysis   Pertinent Imaging:   Assessment & Plan:     No follow-ups on file.  [facility] [address] [place], [place] [address] [phone]  I,Kailey Littlejohn,acting as a scribe for [facility], PA-C.,have documented all relevant documentation on the behalf of SHANNON MCGOWAN, PA-C,as directed by  [facility], PA-C while in the presence of  SHANNON MCGOWAN, PA-C.

## 2021-06-28 ENCOUNTER — Ambulatory Visit: Payer: 59 | Admitting: Urology

## 2021-06-28 ENCOUNTER — Encounter: Payer: Self-pay | Admitting: Urology

## 2021-06-28 VITALS — BP 105/71 | HR 82 | Ht 67.0 in | Wt 195.0 lb

## 2021-06-28 DIAGNOSIS — R102 Pelvic and perineal pain: Secondary | ICD-10-CM

## 2021-06-28 DIAGNOSIS — Z87442 Personal history of urinary calculi: Secondary | ICD-10-CM | POA: Diagnosis not present

## 2021-06-28 DIAGNOSIS — R1032 Left lower quadrant pain: Secondary | ICD-10-CM

## 2021-06-28 DIAGNOSIS — K5900 Constipation, unspecified: Secondary | ICD-10-CM

## 2021-06-28 DIAGNOSIS — N200 Calculus of kidney: Secondary | ICD-10-CM

## 2021-06-28 MED ORDER — SENNOSIDES-DOCUSATE SODIUM 8.6-50 MG PO TABS: 1.0000 | ORAL_TABLET | Freq: Two times a day (BID) | ORAL | 0 refills | Status: DC

## 2021-06-28 NOTE — Patient Instructions (Signed)
Recommend taking MiraLAX over-the-counter 1 times daily in addition to the senna docusate that was prescribed.  If you are having loose stools or diarrhea okay to stop those medications.  We will need to consider CT scan to evaluate further causes of your abdominal pain if you do not have improvement on those medications.  Constipation, Adult Constipation is when a person has fewer than three bowel movements in a week, has difficulty having a bowel movement, or has stools (feces) that are dry, hard, or larger than normal. Constipation may be caused by an underlying condition. It may become worse with age if a person takes certain medicines and does not take in enough fluids. Follow these instructions at home: Eating and drinking  Eat foods that have a lot of fiber, such as beans, whole grains, and fresh fruits and vegetables. Limit foods that are low in fiber and high in fat and processed sugars, such as fried or sweet foods. These include french fries, hamburgers, cookies, candies, and soda. Drink enough fluid to keep your urine pale yellow. General instructions Exercise regularly or as told by your health care provider. Try to do 150 minutes of moderate exercise each week. Use the bathroom when you have the urge to go. Do not hold it in. Take over-the-counter and prescription medicines only as told by your health care provider. This includes any fiber supplements. During bowel movements: Practice deep breathing while relaxing the lower abdomen. Practice pelvic floor relaxation. Watch your condition for any changes. Let your health care provider know about them. Keep all follow-up visits as told by your health care provider. This is important. Contact a health care provider if: You have pain that gets worse. You have a fever. You do not have a bowel movement after 4 days. You vomit. You are not hungry or you lose weight. You are bleeding from the opening between the buttocks (anus). You  have thin, pencil-like stools. Get help right away if: You have a fever and your symptoms suddenly get worse. You leak stool or have blood in your stool. Your abdomen is bloated. You have severe pain in your abdomen. You feel dizzy or you faint. Summary Constipation is when a person has fewer than three bowel movements in a week, has difficulty having a bowel movement, or has stools (feces) that are dry, hard, or larger than normal. Eat foods that have a lot of fiber, such as beans, whole grains, and fresh fruits and vegetables. Drink enough fluid to keep your urine pale yellow. Take over-the-counter and prescription medicines only as told by your health care provider. This includes any fiber supplements. This information is not intended to replace advice given to you by your health care provider. Make sure you discuss any questions you have with your health care provider. Document Revised: 11/13/2018 Document Reviewed: 11/13/2018 Elsevier Patient Education  2023 [employer].

## 2021-06-28 NOTE — Progress Notes (Signed)
   06/28/2021 4:13 PM   Rochester C Ekstein 11/20/1970 [id number]  Reason for visit: Follow up abdominal pain, history nephrolithiasis, possible prostatitis  HPI: 51 year old male who I have seen previously for kidney stones and underwent shockwave lithotripsy in January 2020 for a 7 mm right UPJ stone.  He was seen by the PA Michiel Cowboy on 06/13/2021 for a number of issues including some burning with ejaculation, problems with bowel movements, and left lower quadrant pain.  UA was negative, KUB suggested a possible right proximal stone, and he was ultimately treated with Bactrim for possible prostatitis.  He was also tested for STDs which was negative.  He denies any significant improvement on the Bactrim.  His left lower quadrant pain is relieved with bowel movements.  He denies any right-sided flank pain or gross hematuria.  He denies dysuria.  We discussed other possible etiologies including constipation, diverticulitis, or less likely nephrolithiasis with his benign urinalysis and no definitive evidence of left-sided stones on recent KUB.  We discussed options like CT for further evaluation versus trial of medications for his constipation.  He would like to avoid CT.  Trial of senna docusate/MiraLAX for constipation 2-week follow-up with PA, consider CT at that time if persistent symptoms   Sondra Come, MD  [facility] [address], [address] [place], [place] [address] [phone]

## 2021-07-11 ENCOUNTER — Ambulatory Visit: Payer: 59 | Admitting: Urology

## 2021-08-31 ENCOUNTER — Other Ambulatory Visit: Payer: Self-pay

## 2021-08-31 ENCOUNTER — Emergency Department: Payer: 59

## 2021-08-31 ENCOUNTER — Emergency Department
Admission: EM | Admit: 2021-08-31 | Discharge: 2021-08-31 | Disposition: A | Discharge status: Home / Self Care | Payer: 59 | Attending: Student in an Organized Health Care Education/Training Program | Admitting: Student in an Organized Health Care Education/Training Program

## 2021-08-31 DIAGNOSIS — N200 Calculus of kidney: Secondary | ICD-10-CM

## 2021-08-31 DIAGNOSIS — R109 Unspecified abdominal pain: Secondary | ICD-10-CM

## 2021-08-31 DIAGNOSIS — N202 Calculus of kidney with calculus of ureter: Secondary | ICD-10-CM | POA: Insufficient documentation

## 2021-08-31 LAB — CBC
HCT: 41.8 % (ref 39.0–52.0)
Hemoglobin: 14.1 g/dL (ref 13.0–17.0)
MCH: 30.4 pg (ref 26.0–34.0)
MCHC: 33.7 g/dL (ref 30.0–36.0)
MCV: 90.1 fL (ref 80.0–100.0)
Platelets: 243 10*3/uL (ref 150–400)
RBC: 4.64 MIL/uL (ref 4.22–5.81)
RDW: 12.5 % (ref 11.5–15.5)
WBC: 10.7 10*3/uL — ABNORMAL HIGH (ref 4.0–10.5)
nRBC: 0 % (ref 0.0–0.2)

## 2021-08-31 LAB — URINALYSIS, ROUTINE W REFLEX MICROSCOPIC
Bacteria, UA: NONE SEEN
Bilirubin Urine: NEGATIVE
Glucose, UA: NEGATIVE mg/dL
Ketones, ur: NEGATIVE mg/dL
Leukocytes,Ua: NEGATIVE
Nitrite: NEGATIVE
Protein, ur: NEGATIVE mg/dL
RBC / HPF: >50 RBC/hpf — ABNORMAL HIGH (ref 0–5)
Specific Gravity, Urine: 1.016 (ref 1.005–1.030)
Squamous Epithelial / HPF: NONE SEEN (ref 0–5)
pH: 6 (ref 5.0–8.0)

## 2021-08-31 LAB — BASIC METABOLIC PANEL
Anion gap: 7 (ref 5–15)
BUN: 14 mg/dL (ref 6–20)
CO2: 26 mmol/L (ref 22–32)
Calcium: 9.3 mg/dL (ref 8.9–10.3)
Chloride: 106 mmol/L (ref 98–111)
Creatinine, Ser: 1.15 mg/dL (ref 0.61–1.24)
GFR, Estimated: >60 mL/min (ref >60)
Glucose, Bld: 110 mg/dL — ABNORMAL HIGH (ref 70–99)
Potassium: 4.2 mmol/L (ref 3.5–5.1)
Sodium: 139 mmol/L (ref 135–145)

## 2021-08-31 MED ORDER — TAMSULOSIN HCL 0.4 MG PO CAPS
0.4000 mg | ORAL_CAPSULE | Freq: Every day | ORAL | 0 refills | Status: DC
Start: 2021-08-31 — End: 2021-09-20

## 2021-08-31 MED ORDER — ONDANSETRON 4 MG PO TBDP
4.0000 mg | ORAL_TABLET | Freq: Once | ORAL | Status: AC
  Administered 2021-08-31: 4 mg via ORAL
  Filled 2021-08-31: qty 1

## 2021-08-31 MED ORDER — SODIUM CHLORIDE 0.9 % IV BOLUS
1000.0000 mL | Freq: Once | INTRAVENOUS | Status: AC
  Administered 2021-08-31: 1000 mL via INTRAVENOUS

## 2021-08-31 MED ORDER — KETOROLAC TROMETHAMINE 30 MG/ML IJ SOLN
15.0000 mg | Freq: Once | INTRAMUSCULAR | Status: AC
  Administered 2021-08-31: 15 mg via INTRAVENOUS
  Filled 2021-08-31: qty 1

## 2021-08-31 MED ORDER — OXYCODONE-ACETAMINOPHEN 5-325 MG PO TABS: 1.0000 | ORAL_TABLET | ORAL | 0 refills | Status: DC | PRN

## 2021-08-31 MED ORDER — OXYCODONE-ACETAMINOPHEN 5-325 MG PO TABS
1.0000 | ORAL_TABLET | ORAL | Status: DC | PRN
  Administered 2021-08-31: 1 via ORAL
  Filled 2021-08-31: qty 1

## 2021-08-31 MED ORDER — MORPHINE SULFATE (PF) 4 MG/ML IV SOLN
4.0000 mg | INTRAVENOUS | Status: DC | PRN
  Administered 2021-08-31: 4 mg via INTRAVENOUS
  Filled 2021-08-31: qty 1

## 2021-08-31 MED ORDER — ONDANSETRON 4 MG PO TBDP: 4.0000 mg | ORAL_TABLET | Freq: Three times a day (TID) | ORAL | 0 refills | Status: DC | PRN

## 2021-08-31 NOTE — ED Triage Notes (Signed)
Pt presents to ER with c/o left side flank pain that started around 0930 today while at work.  Pt states he was not doing any heavy lifting when pain started.  Pt endorses some burning w/urination.  Pt states pain radiates into LLQ/groin area.  Pt endorses nausea but denies vomiting, and states last BM was around lunch and was normal.  Pt is A&O x4 in triage and is grimacing in pain.

## 2021-08-31 NOTE — ED Provider Notes (Signed)
[facility] Provider Note    Event Date/Time   First MD Initiated Contact with Patient 08/31/21 2119     (approximate)   History   Flank Pain   HPI  Denney C Giammarco is a 51 y.o. male with a history of kidney stones presents to the ER for evaluation of acute left flank pain.  Symptoms started this evening.  Has had blood in urine.  Feels similar to previous kidney stones.  Denies any fevers no chills no burning discomfort with urination.     Physical Exam   Triage Vital Signs: ED Triage Vitals  Enc Vitals Group     BP 08/31/21 1922 132/75     Pulse Rate 08/31/21 1922 64     Resp 08/31/21 1922 18     Temp 08/31/21 1922 97.6 F (36.4 C)     Temp Source 08/31/21 1922 Oral     SpO2 08/31/21 1922 98 %     Weight 08/31/21 1923 194 lb (88 kg)     Height 08/31/21 1923 5\' 7"  (1.702 m)     Head Circumference --      Peak Flow --      Pain Score 08/31/21 1922 10     Pain Loc --      Pain Edu? --      Excl. in GC? --     Most recent vital signs: Vitals:   08/31/21 1922 08/31/21 2231  BP: 132/75 115/79  Pulse: 64 79  Resp: 18 18  Temp: 97.6 F (36.4 C) 97.7 F (36.5 C)  SpO2: 98% 95%     Constitutional: Alert  Eyes: Conjunctivae are normal.  Head: Atraumatic. Nose: No congestion/rhinnorhea. Mouth/Throat: Mucous membranes are moist.   Neck: Painless ROM.  Cardiovascular:   Good peripheral circulation. Respiratory: Normal respiratory effort.  No retractions.  Gastrointestinal: Soft and nontender.  Musculoskeletal:  no deformity Neurologic:  MAE spontaneously. No gross focal neurologic deficits are appreciated.  Skin:  Skin is warm, dry and intact. No rash noted. Psychiatric: Mood and affect are normal. Speech and behavior are normal.    ED Results / Procedures / Treatments   Labs (all labs ordered are listed, but only abnormal results are displayed) Labs Reviewed  CBC - Abnormal; Notable for the following components:      Result  Value   WBC 10.7 (*)    All other components within normal limits  BASIC METABOLIC PANEL - Abnormal; Notable for the following components:   Glucose, Bld 110 (*)    All other components within normal limits  URINALYSIS, ROUTINE W REFLEX MICROSCOPIC - Abnormal; Notable for the following components:   Color, Urine YELLOW (*)    APPearance CLEAR (*)    Hgb urine dipstick LARGE (*)    RBC / HPF >50 (*)    All other components within normal limits     EKG     RADIOLOGY Please see ED Course for my review and interpretation.  I personally reviewed all radiographic images ordered to evaluate for the above acute complaints and reviewed radiology reports and findings.  These findings were personally discussed with the patient.  Please see medical record for radiology report.    PROCEDURES:  Critical Care performed: No  Procedures   MEDICATIONS ORDERED IN ED: Medications  oxyCODONE-acetaminophen (PERCOCET/ROXICET) 5-325 MG per tablet 1 tablet (1 tablet Oral Given 08/31/21 1927)  morphine (PF) 4 MG/ML injection 4 mg (4 mg Intravenous Given 08/31/21 2157)  ondansetron (ZOFRAN-ODT)  disintegrating tablet 4 mg (4 mg Oral Given 08/31/21 1927)  sodium chloride 0.9 % bolus 1,000 mL (1,000 mLs Intravenous New Bag/Given 08/31/21 2208)  ketorolac (TORADOL) 30 MG/ML injection 15 mg (15 mg Intravenous Given 08/31/21 2158)     IMPRESSION / MDM / ASSESSMENT AND PLAN / ED COURSE  I reviewed the triage vital signs and the nursing notes.                              Differential diagnosis includes, but is not limited to, stone, pyelo-, ureteral spasm, AAA  She presented to the ER for evaluation of symptoms as described above.  This presenting complaint could reflect a potentially life-threatening illness therefore the patient will be placed on continuous pulse oximetry and telemetry for monitoring.  Laboratory evaluation will be sent to evaluate for the above complaints.  CT imaging ordered for the  but differential as well my review and interpretation shows evidence of left-sided ureteral stone 7 mm.  I discussed case in consultation with urology.  Given the distal position of the stone okay for Toradol and close outpatient follow-up.  Patient given IV morphine and IV fluids with symptoms improved.  Stable and appropriate for outpatient follow-up.  Clinical Course as of 08/31/21 2240  Wed Aug 31, 2021  2237 Patient reassessed.  Pain significantly improved.  Does appear stable and appropriate for outpatient follow-up. [PR]    Clinical Course User Index [PR] Willy Eddy, MD       FINAL CLINICAL IMPRESSION(S) / ED DIAGNOSES   Final diagnoses:  Left flank pain  Kidney stone     Rx / DC Orders   ED Discharge Orders          Ordered    oxyCODONE-acetaminophen (PERCOCET) 5-325 MG tablet  Every 4 hours PRN        08/31/21 2239    ondansetron (ZOFRAN-ODT) 4 MG disintegrating tablet  Every 8 hours PRN        08/31/21 2239    tamsulosin (FLOMAX) 0.4 MG CAPS capsule  Daily after supper        08/31/21 2239             Note:  This document was prepared using Dragon voice recognition software and may include unintentional dictation errors.    Willy Eddy, MD 08/31/21 2241

## 2021-09-01 ENCOUNTER — Encounter: Payer: Self-pay | Admitting: Urology

## 2021-09-01 ENCOUNTER — Ambulatory Visit: Payer: 59 | Admitting: Urology

## 2021-09-01 ENCOUNTER — Encounter
Admission: RE | Disposition: A | Discharge status: Home / Self Care | Payer: Self-pay | Source: Home / Self Care | Attending: Urology

## 2021-09-01 ENCOUNTER — Ambulatory Visit
Admission: RE | Admit: 2021-09-01 | Discharge: 2021-09-01 | Disposition: A | Discharge status: Home / Self Care | Payer: 59 | Attending: Urology | Admitting: Urology

## 2021-09-01 ENCOUNTER — Other Ambulatory Visit: Payer: Self-pay

## 2021-09-01 ENCOUNTER — Ambulatory Visit: Payer: 59

## 2021-09-01 VITALS — BP 127/87 | HR 85 | Ht 67.0 in | Wt 192.0 lb

## 2021-09-01 DIAGNOSIS — N200 Calculus of kidney: Secondary | ICD-10-CM

## 2021-09-01 DIAGNOSIS — N201 Calculus of ureter: Secondary | ICD-10-CM

## 2021-09-01 HISTORY — PX: EXTRACORPOREAL SHOCK WAVE LITHOTRIPSY: SHX1557

## 2021-09-01 SURGERY — LITHOTRIPSY, ESWL
Anesthesia: Moderate Sedation | Laterality: Left

## 2021-09-01 MED ORDER — ONDANSETRON HCL 4 MG/2ML IJ SOLN: 4.0000 mg | Freq: Once | INTRAMUSCULAR | Status: AC

## 2021-09-01 MED ORDER — DIAZEPAM 5 MG PO TABS
ORAL_TABLET | ORAL | Status: AC
  Administered 2021-09-01: 10 mg via ORAL
  Filled 2021-09-01: qty 2

## 2021-09-01 MED ORDER — CEPHALEXIN 500 MG PO CAPS: 500.0000 mg | ORAL_CAPSULE | Freq: Once | ORAL | Status: AC

## 2021-09-01 MED ORDER — DIPHENHYDRAMINE HCL 25 MG PO CAPS: 25.0000 mg | ORAL_CAPSULE | ORAL | Status: AC

## 2021-09-01 MED ORDER — SODIUM CHLORIDE 0.9 % IV SOLN: INTRAVENOUS | Status: DC

## 2021-09-01 MED ORDER — DIAZEPAM 5 MG PO TABS: 10.0000 mg | ORAL_TABLET | ORAL | Status: AC

## 2021-09-01 MED ORDER — ONDANSETRON HCL 4 MG/2ML IJ SOLN
INTRAMUSCULAR | Status: AC
  Administered 2021-09-01: 4 mg via INTRAVENOUS
  Filled 2021-09-01: qty 2

## 2021-09-01 MED ORDER — DIPHENHYDRAMINE HCL 25 MG PO CAPS
ORAL_CAPSULE | ORAL | Status: AC
  Administered 2021-09-01: 25 mg via ORAL
  Filled 2021-09-01: qty 1

## 2021-09-01 MED ORDER — CEPHALEXIN 500 MG PO CAPS
ORAL_CAPSULE | ORAL | Status: AC
  Administered 2021-09-01: 500 mg via ORAL
  Filled 2021-09-01: qty 1

## 2021-09-01 NOTE — Progress Notes (Signed)
ESWL ORDER FORM  Expected date of procedure: 14970263  Surgeon: Hollice Espy, MD  Post op standing: 2-4wk follow up w/KUB prior  Anticoagulation/Aspirin/NSAID standing order: Hold all 72 hours prior  Anesthesia standing order: MAC  VTE standing: SCD's  Dx: Left Nephrolithiasis  Procedure: left Extracorporeal shock wave lithotripsy  CPT : 78588  Standing Order Set:   *NPO after mn, KUB  *NS 146m/hr, Keflex 5098mPO, Benadryl 2536mO, Valium 52m72m, Zofran 4mg 79m   Medications if other than standing orders:   NONE

## 2021-09-01 NOTE — Discharge Instructions (Addendum)
See [facility] discharge instructions in chart. AMBULATORY SURGERY  DISCHARGE INSTRUCTIONS   The drugs that you were given will stay in your system until tomorrow so for the next 24 hours you should not:  Drive an automobile Make any legal decisions Drink any alcoholic beverage   You may resume regular meals tomorrow.  Today it is better to start with liquids and gradually work up to solid foods.  You may eat anything you prefer, but it is better to start with liquids, then soup and crackers, and gradually work up to solid foods.   Please notify your doctor immediately if you have any unusual bleeding, trouble breathing, redness and pain at the surgery site, drainage, fever, or pain not relieved by medication.    Additional Instructions:        Please contact your physician with any problems or Same Day Surgery at [phone], Monday through Friday 6 am to 4 pm, or [place] [facility] number at [phone].  

## 2021-09-01 NOTE — Progress Notes (Signed)
09/01/21 12:48 PM   Tyler Watson 08-May-1970 [id number]  Referring provider:  [facility] [address] [place],  [place] [address] Chief Complaint  Patient presents with   Nephrolithiasis    New Patient    HPI: Tyler Watson is a 51 y.o.male who presents today for further evaluation of kidney stones.  He has a personal history of kidney stones and underwent shockwave lithotripsy in January 2020 for a 7 mm right UPJ stone.He was seen by Michiel Cowboy, PAC on 06/13/2021 for issues including some burning with ejaculation, problems with bowel movements, lower left quadrant pain. He underwent a KUB that suggested a possible right proximal stone, and he was ultimately treated with Bactrim for possible prostatitis.   He was last seen in the clinic by Dr Richardo Hanks on 06/28/2021. At time he denied improvement on the Bactrim and his left lower quadrant pain was relieved with bowel movements.  He was seen in the ED yesterday for further evaluation of acute flank pain and blood in urine. UA had >50 RBCs and large Hgb dipstick. His creatinine was WNL. CT renal stone study visualized mild left hydronephrosis caused by 7 mm calculus in the left ureter at the level of upper endplate of body of L5 vertebra. There is left perinephric stranding which may be due to ureteric obstruction or superimposed infection. There are multiple small bilateral renal stones.  He reports that he pain has moved from his back. He has not seen passage of the stone.    PMH: Past Medical History:  Diagnosis Date   Kidney stones     Surgical History: Past Surgical History:  Procedure Laterality Date   EXTRACORPOREAL SHOCK WAVE LITHOTRIPSY Right 01/17/2018   Procedure: EXTRACORPOREAL SHOCK WAVE LITHOTRIPSY (ESWL);  Surgeon: Vanna Scotland, MD;  Location: [facility] ORS;  Service: Urology;  Laterality: Right;   LITHOTRIPSY      Home Medications:  Allergies as of 09/01/2021   No Known Allergies       Medication List        Accurate as of September 01, 2021 12:48 PM. If you have any questions, ask your nurse or doctor.          STOP taking these medications    senna-docusate 8.6-50 MG tablet Commonly known as: Senokot-S Stopped by: Vanna Scotland, MD       TAKE these medications    ondansetron 4 MG disintegrating tablet Commonly known as: ZOFRAN-ODT Take 1 tablet (4 mg total) by mouth every 8 (eight) hours as needed for nausea or vomiting.   oxyCODONE-acetaminophen 5-325 MG tablet Commonly known as: Percocet Take 1 tablet by mouth every 4 (four) hours as needed for severe pain.   tamsulosin 0.4 MG Caps capsule Commonly known as: FLOMAX Take 1 capsule (0.4 mg total) by mouth daily after supper.        Allergies:  No Known Allergies  Family History: Family History  Problem Relation Age of Onset   Hypertension Mother    Heart attack Father 73    Social History:  reports that he has never smoked. He has never been exposed to tobacco smoke. He has never used smokeless tobacco. He reports that he does not currently use alcohol. He reports that he does not use drugs.   Physical Exam: BP 127/87   Pulse 85   Ht 5\' 7"  (1.702 m)   Wt 192 lb (87.1 kg)   BMI 30.07 kg/m   Constitutional:  Alert and oriented,  No acute distress. HEENT: [place] AT, moist mucus membranes.  Trachea midline, no masses. Cardiovascular: No clubbing, cyanosis, or edema. Respiratory: Normal respiratory effort, no increased work of breathing. Skin: No rashes, bruises or suspicious lesions. Neurologic: Grossly intact, no focal deficits, moving all 4 extremities. Psychiatric: Normal mood and affect.  Laboratory Data:  Lab Results  Component Value Date   CREATININE 1.15 08/31/2021      Pertinent Imaging: CLINICAL DATA:  Left flank pain   EXAM: CT ABDOMEN AND PELVIS WITHOUT CONTRAST   TECHNIQUE: Multidetector CT imaging of the abdomen and pelvis was performed following the standard  protocol without IV contrast.   RADIATION DOSE REDUCTION: This exam was performed according to the departmental dose-optimization program which includes automated exposure control, adjustment of the mA and/or kV according to patient size and/or use of iterative reconstruction technique.   COMPARISON:  01/08/2018   FINDINGS: Lower chest: Unremarkable.   Hepatobiliary: No focal abnormalities are seen in the liver. There is no dilation of bile ducts. Gallbladder is not distended.   Pancreas: No focal abnormalities are seen.   Spleen: Unremarkable.   Adrenals/Urinary Tract: Adrenals are unremarkable. There is mild left hydronephrosis. There is 7 mm calculus in left ureter at L5 level. There is mild left perinephric stranding. There is no hydronephrosis in the right kidney. There are multiple small bilateral renal stones each measuring less than 3 mm. Distal ureters are not dilated. Urinary bladder is not distended.   Stomach/Bowel: Stomach is unremarkable. Small bowel loops are not dilated. Appendix is not dilated. There is no significant wall thickening and colon. There is no pericolic stranding.   Vascular/Lymphatic: Scattered arterial calcifications are seen.   Reproductive: Unremarkable.   Other: There is no ascites or pneumoperitoneum. Small umbilical hernia containing fat is seen.   Musculoskeletal: Unremarkable.   IMPRESSION: There is mild left hydronephrosis caused by 7 mm calculus in the left ureter at the level of upper endplate of body of L5 vertebra. There is left perinephric stranding which may be due to ureteric obstruction or superimposed infection. There are multiple small bilateral renal stones.   There is no evidence of intestinal obstruction or pneumoperitoneum. Appendix is not dilated.     Electronically Signed   By: Ernie Avena M.D.   On: 08/31/2021 20:18  Ct scan personally reviewed, agree with interpretation   Assessment & Plan:    Left ureteral calculus  - We discussed various treatment options for urolithiasis including observation with or without medical expulsive therapy, shockwave lithotripsy (SWL), ureteroscopy and laser lithotripsy with stent placement, and percutaneous nephrolithotomy.   We discussed that management is based on stone size, location, density, patient co-morbidities, and patient preference.    Stones <67mm in size have a >80% spontaneous passage rate. Data surrounding the use of tamsulosin for medical expulsive therapy is controversial, but meta analyses suggests it is most efficacious for distal stones between 5-74mm in size. Possible side effects include dizziness/lightheadedness, and retrograde ejaculation.   SWL has a lower stone free rate in a single procedure, but also a lower complication rate compared to ureteroscopy and avoids a stent and associated stent related symptoms. Possible complications include renal hematoma, steinstrasse, and need for additional treatment. We discussed the role of his increased skin to stone distance can lead to decreased efficacy with shockwave lithotripsy.   Ureteroscopy with laser lithotripsy and stent placement has a higher stone free rate than SWL in a single procedure, however increased complication rate including possible infection, ureteral injury,  bleeding, and stent related morbidity. Common stent related symptoms include dysuria, urgency/frequency, and flank pain.   After an extensive discussion of the risks and benefits of the above treatment options, the patient would like to proceed with ESWL     Tawni Millers as a scribe for Vanna Scotland, MD.,have documented all relevant documentation on the behalf of Vanna Scotland, MD,as directed by  Vanna Scotland, MD while in the presence of Vanna Scotland, MD.  I have reviewed the above documentation for accuracy and completeness, and I agree with the above.   Vanna Scotland, MD  [facility] [address], [address] [place], [place] [address] [phone]

## 2021-09-01 NOTE — H&P (View-Only) (Signed)
  09/01/21 12:48 PM   Tyler Watson 12/31/1970 [id number]  Referring provider:  [place] [address] [place] [place],  [place] [address] Chief Complaint  Patient presents with   Nephrolithiasis    New Patient    HPI: Tyler Watson is a 51 y.o.male who presents today for further evaluation of kidney stones.  He has a personal history of kidney stones and underwent shockwave lithotripsy in January 2020 for a 7 mm right UPJ stone.He was seen by Shannon McGowan, PAC on 06/13/2021 for issues including some burning with ejaculation, problems with bowel movements, lower left quadrant pain. He underwent a KUB that suggested a possible right proximal stone, and he was ultimately treated with Bactrim for possible prostatitis.   He was last seen in the clinic by Dr Sninsky on 06/28/2021. At time he denied improvement on the Bactrim and his left lower quadrant pain was relieved with bowel movements.  He was seen in the ED yesterday for further evaluation of acute flank pain and blood in urine. UA had >50 RBCs and large Hgb dipstick. His creatinine was WNL. CT renal stone study visualized mild left hydronephrosis caused by 7 mm calculus in the left ureter at the level of upper endplate of body of L5 vertebra. There is left perinephric stranding which may be due to ureteric obstruction or superimposed infection. There are multiple small bilateral renal stones.  He reports that he pain has moved from his back. He has not seen passage of the stone.    PMH: Past Medical History:  Diagnosis Date   Kidney stones     Surgical History: Past Surgical History:  Procedure Laterality Date   EXTRACORPOREAL SHOCK WAVE LITHOTRIPSY Right 01/17/2018   Procedure: EXTRACORPOREAL SHOCK WAVE LITHOTRIPSY (ESWL);  Surgeon: Rachelann Enloe, MD;  Location: [facility] ORS;  Service: Urology;  Laterality: Right;   LITHOTRIPSY      Home Medications:  Allergies as of 09/01/2021   No Known Allergies       Medication List        Accurate as of September 01, 2021 12:48 PM. If you have any questions, ask your nurse or doctor.          STOP taking these medications    senna-docusate 8.6-50 MG tablet Commonly known as: Senokot-S Stopped by: Brean Carberry, MD       TAKE these medications    ondansetron 4 MG disintegrating tablet Commonly known as: ZOFRAN-ODT Take 1 tablet (4 mg total) by mouth every 8 (eight) hours as needed for nausea or vomiting.   oxyCODONE-acetaminophen 5-325 MG tablet Commonly known as: Percocet Take 1 tablet by mouth every 4 (four) hours as needed for severe pain.   tamsulosin 0.4 MG Caps capsule Commonly known as: FLOMAX Take 1 capsule (0.4 mg total) by mouth daily after supper.        Allergies:  No Known Allergies  Family History: Family History  Problem Relation Age of Onset   Hypertension Mother    Heart attack Father 87    Social History:  reports that he has never smoked. He has never been exposed to tobacco smoke. He has never used smokeless tobacco. He reports that he does not currently use alcohol. He reports that he does not use drugs.   Physical Exam: BP 127/87   Pulse 85   Ht 5' 7" (1.702 m)   Wt 192 lb (87.1 kg)   BMI 30.07 kg/m   Constitutional:  Alert and oriented,   No acute distress. HEENT: [place] AT, moist mucus membranes.  Trachea midline, no masses. Cardiovascular: No clubbing, cyanosis, or edema. Respiratory: Normal respiratory effort, no increased work of breathing. Skin: No rashes, bruises or suspicious lesions. Neurologic: Grossly intact, no focal deficits, moving all 4 extremities. Psychiatric: Normal mood and affect.  Laboratory Data:  Lab Results  Component Value Date   CREATININE 1.15 08/31/2021      Pertinent Imaging: CLINICAL DATA:  Left flank pain   EXAM: CT ABDOMEN AND PELVIS WITHOUT CONTRAST   TECHNIQUE: Multidetector CT imaging of the abdomen and pelvis was performed following the standard  protocol without IV contrast.   RADIATION DOSE REDUCTION: This exam was performed according to the departmental dose-optimization program which includes automated exposure control, adjustment of the mA and/or kV according to patient size and/or use of iterative reconstruction technique.   COMPARISON:  01/08/2018   FINDINGS: Lower chest: Unremarkable.   Hepatobiliary: No focal abnormalities are seen in the liver. There is no dilation of bile ducts. Gallbladder is not distended.   Pancreas: No focal abnormalities are seen.   Spleen: Unremarkable.   Adrenals/Urinary Tract: Adrenals are unremarkable. There is mild left hydronephrosis. There is 7 mm calculus in left ureter at L5 level. There is mild left perinephric stranding. There is no hydronephrosis in the right kidney. There are multiple small bilateral renal stones each measuring less than 3 mm. Distal ureters are not dilated. Urinary bladder is not distended.   Stomach/Bowel: Stomach is unremarkable. Small bowel loops are not dilated. Appendix is not dilated. There is no significant wall thickening and colon. There is no pericolic stranding.   Vascular/Lymphatic: Scattered arterial calcifications are seen.   Reproductive: Unremarkable.   Other: There is no ascites or pneumoperitoneum. Small umbilical hernia containing fat is seen.   Musculoskeletal: Unremarkable.   IMPRESSION: There is mild left hydronephrosis caused by 7 mm calculus in the left ureter at the level of upper endplate of body of L5 vertebra. There is left perinephric stranding which may be due to ureteric obstruction or superimposed infection. There are multiple small bilateral renal stones.   There is no evidence of intestinal obstruction or pneumoperitoneum. Appendix is not dilated.     Electronically Signed   By: Ernie Avena M.D.   On: 08/31/2021 20:18  Ct scan personally reviewed, agree with interpretation   Assessment & Plan:    Left ureteral calculus  - We discussed various treatment options for urolithiasis including observation with or without medical expulsive therapy, shockwave lithotripsy (SWL), ureteroscopy and laser lithotripsy with stent placement, and percutaneous nephrolithotomy.   We discussed that management is based on stone size, location, density, patient co-morbidities, and patient preference.    Stones <67mm in size have a >80% spontaneous passage rate. Data surrounding the use of tamsulosin for medical expulsive therapy is controversial, but meta analyses suggests it is most efficacious for distal stones between 5-74mm in size. Possible side effects include dizziness/lightheadedness, and retrograde ejaculation.   SWL has a lower stone free rate in a single procedure, but also a lower complication rate compared to ureteroscopy and avoids a stent and associated stent related symptoms. Possible complications include renal hematoma, steinstrasse, and need for additional treatment. We discussed the role of his increased skin to stone distance can lead to decreased efficacy with shockwave lithotripsy.   Ureteroscopy with laser lithotripsy and stent placement has a higher stone free rate than SWL in a single procedure, however increased complication rate including possible infection, ureteral injury,  bleeding, and stent related morbidity. Common stent related symptoms include dysuria, urgency/frequency, and flank pain.   After an extensive discussion of the risks and benefits of the above treatment options, the patient would like to proceed with ESWL     Tawni Millers as a scribe for Vanna Scotland, MD.,have documented all relevant documentation on the behalf of Vanna Scotland, MD,as directed by  Vanna Scotland, MD while in the presence of Vanna Scotland, MD.  I have reviewed the above documentation for accuracy and completeness, and I agree with the above.   Vanna Scotland, MD  [facility]  [facility] [address], [address] [place], [place] [address] [phone]

## 2021-09-01 NOTE — Interval H&P Note (Signed)
History and Physical Interval Note:  09/01/2021 2:03 PM  Tyler Watson  has presented today for surgery, with the diagnosis of Left Nephrolithiasis.  The various methods of treatment have been discussed with the patient and family. After consideration of risks, benefits and other options for treatment, the patient has consented to  Procedure(s): EXTRACORPOREAL SHOCK WAVE LITHOTRIPSY (ESWL) (Left) as a surgical intervention.  The patient's history has been reviewed, patient examined, no change in status, stable for surgery.  I have reviewed the patient's chart and labs.  Questions were answered to the patient's satisfaction.    RRR CTAB  Vanna Scotland

## 2021-09-02 ENCOUNTER — Encounter: Payer: Self-pay | Admitting: Urology

## 2021-09-19 ENCOUNTER — Ambulatory Visit
Admission: RE | Admit: 2021-09-19 | Discharge: 2021-09-19 | Disposition: A | Discharge status: Home / Self Care | Payer: 59 | Source: Ambulatory Visit | Attending: Physician Assistant | Admitting: Physician Assistant

## 2021-09-19 ENCOUNTER — Other Ambulatory Visit: Payer: Self-pay

## 2021-09-19 DIAGNOSIS — N200 Calculus of kidney: Secondary | ICD-10-CM | POA: Diagnosis present

## 2021-09-20 ENCOUNTER — Ambulatory Visit: Payer: 59 | Admitting: Physician Assistant

## 2021-09-20 ENCOUNTER — Encounter: Payer: Self-pay | Admitting: Physician Assistant

## 2021-09-20 VITALS — BP 117/80 | HR 74 | Ht 67.0 in | Wt 192.4 lb

## 2021-09-20 DIAGNOSIS — Z87442 Personal history of urinary calculi: Secondary | ICD-10-CM | POA: Diagnosis not present

## 2021-09-20 DIAGNOSIS — N201 Calculus of ureter: Secondary | ICD-10-CM

## 2021-09-20 NOTE — Progress Notes (Signed)
09/20/2021 3:36 PM   Tyler Watson Sep 03, 1970 [id number]  CC: Chief Complaint  Patient presents with   Follow-up   HPI: Tyler Watson is a 51 y.o. male who underwent ESWL with Dr. Apolinar Junes on 09/01/2021 for management of a 7 mm mid left ureteral stone who presents today for postop follow-up.  Operative note describes smudging of the stone.  Today he reports resolution of his pain.  He has passed multiple stone fragments, which she brings with him today for analysis.  He is curious about stone prevention dietary recommendations.  KUB today with interval resolution of the mid left ureteral stone.  PMH: Past Medical History:  Diagnosis Date   Kidney stones     Surgical History: Past Surgical History:  Procedure Laterality Date   EXTRACORPOREAL SHOCK WAVE LITHOTRIPSY Right 01/17/2018   Procedure: EXTRACORPOREAL SHOCK WAVE LITHOTRIPSY (ESWL);  Surgeon: Vanna Scotland, MD;  Location: [facility];  Service: Urology;  Laterality: Right;   EXTRACORPOREAL SHOCK WAVE LITHOTRIPSY Left 09/01/2021   Procedure: EXTRACORPOREAL SHOCK WAVE LITHOTRIPSY (ESWL);  Surgeon: Vanna Scotland, MD;  Location: [facility];  Service: Urology;  Laterality: Left;   LITHOTRIPSY      Home Medications:  Allergies as of 09/20/2021   No Known Allergies      Medication List        Accurate as of September 20, 2021  3:36 PM. If you have any questions, ask your nurse or doctor.          STOP taking these medications    ondansetron 4 MG disintegrating tablet Commonly known as: ZOFRAN-ODT Stopped by: Carman Ching, PA-C   oxyCODONE-acetaminophen 5-325 MG tablet Commonly known as: Percocet Stopped by: Carman Ching, PA-C   tamsulosin 0.4 MG Caps capsule Commonly known as: FLOMAX Stopped by: Carman Ching, PA-C        Allergies:  No Known Allergies  Family History: Family History  Problem Relation Age of Onset   Hypertension Mother    Heart attack Father 33     Social History:   reports that he has never smoked. He has never been exposed to tobacco smoke. He has never used smokeless tobacco. He reports that he does not currently use alcohol. He reports that he does not use drugs.  Physical Exam: BP 117/80   Pulse 74   Ht 5\' 7"  (1.702 m)   Wt 192 lb 6.4 oz (87.3 kg)   BMI 30.13 kg/m   Constitutional:  Alert and oriented, no acute distress, nontoxic appearing HEENT: [place], AT Cardiovascular: No clubbing, cyanosis, or edema Respiratory: Normal respiratory effort, no increased work of breathing Skin: No rashes, bruises or suspicious lesions Neurologic: Grossly intact, no focal deficits, moving all 4 extremities Psychiatric: Normal mood and affect  Pertinent Imaging: KUB, 09/19/2021: CLINICAL DATA:  Follow-up from lithotripsy done on 08/24   EXAM: ABDOMEN - 1 VIEW   COMPARISON:  Radiographs 09/01/2021   FINDINGS: Bowel-gas pattern is normal.  Large stool load in the right colon.   Multiple punctate calcifications project over the renal shadows bilaterally. The previously seen 7 mm calcification along the mid to distal left ureter is no longer visualized. No additional radiopaque ureteral or bladder stones.   IMPRESSION: 7 mm left ureteral stone seen on the previous radiographs is no longer visualized.   Punctate bilateral nephrolithiasis.     Electronically Signed   By: 09/03/2021 M.D.   On: 09/20/2021 23:18  I personally reviewed the images referenced above and note interval resolution  of the mid left ureteral stone.  Assessment & Plan:   1. Left ureteral stone He has passed multiple fragments, symptoms have resolved, and interval resolution of the stone on KUB today.  We will send fragments for analysis.  I encouraged him to sign up for MyChart, so I can send him tailored stone prevention recommendations once I have his analysis results back.  He is in agreement with this plan. - Calculi, with Photograph (to Clinical  Lab)  Return in about 1 year (around 09/21/2022) for Annual stone visit with KUB prior, Will share stone analysis results via MyChart.  Carman Ching, PA-C  [facility] [address], [address] [place], [place] [address] [phone]

## 2021-09-27 LAB — CALCULI, WITH PHOTOGRAPH (CLINICAL LAB)
Calcium Oxalate Monohydrate: 100 %
Weight Calculi: 73 mg

## 2021-11-01 ENCOUNTER — Ambulatory Visit: Payer: 59 | Admitting: Urology

## 2022-02-28 ENCOUNTER — Ambulatory Visit
Admission: RE | Admit: 2022-02-28 | Discharge: 2022-02-28 | Disposition: A | Discharge status: Home / Self Care | Payer: 59 | Attending: Urology | Admitting: Urology

## 2022-02-28 ENCOUNTER — Other Ambulatory Visit: Payer: Self-pay

## 2022-02-28 ENCOUNTER — Encounter: Payer: Self-pay | Admitting: Urology

## 2022-02-28 ENCOUNTER — Other Ambulatory Visit
Admission: RE | Admit: 2022-02-28 | Discharge: 2022-02-28 | Disposition: A | Discharge status: Home / Self Care | Payer: 59 | Source: Home / Self Care | Attending: Urology | Admitting: Urology

## 2022-02-28 ENCOUNTER — Ambulatory Visit: Payer: 59 | Admitting: Urology

## 2022-02-28 ENCOUNTER — Ambulatory Visit
Admission: RE | Admit: 2022-02-28 | Discharge: 2022-02-28 | Disposition: A | Discharge status: Home / Self Care | Payer: 59 | Source: Ambulatory Visit | Attending: Urology | Admitting: Urology

## 2022-02-28 VITALS — BP 118/80 | HR 80 | Ht 67.0 in | Wt 195.0 lb

## 2022-02-28 DIAGNOSIS — N200 Calculus of kidney: Secondary | ICD-10-CM

## 2022-02-28 DIAGNOSIS — R102 Pelvic and perineal pain: Secondary | ICD-10-CM | POA: Insufficient documentation

## 2022-02-28 DIAGNOSIS — N201 Calculus of ureter: Secondary | ICD-10-CM

## 2022-02-28 DIAGNOSIS — R300 Dysuria: Secondary | ICD-10-CM

## 2022-02-28 DIAGNOSIS — Z87442 Personal history of urinary calculi: Secondary | ICD-10-CM

## 2022-02-28 LAB — URINALYSIS, COMPLETE (UACMP) WITH MICROSCOPIC
Bilirubin Urine: NEGATIVE
Glucose, UA: NEGATIVE mg/dL
Hgb urine dipstick: NEGATIVE
Ketones, ur: NEGATIVE mg/dL
Leukocytes,Ua: NEGATIVE
Nitrite: NEGATIVE
Protein, ur: NEGATIVE mg/dL
Specific Gravity, Urine: 1.02 (ref 1.005–1.030)
Squamous Epithelial / HPF: NONE SEEN /HPF (ref 0–5)
pH: 7 (ref 5.0–8.0)

## 2022-02-28 MED ORDER — CELECOXIB 200 MG PO CAPS: 200.0000 mg | ORAL_CAPSULE | Freq: Two times a day (BID) | ORAL | 0 refills | Status: AC

## 2022-02-28 MED ORDER — SENNOSIDES-DOCUSATE SODIUM 8.6-50 MG PO TABS: 1.0000 | ORAL_TABLET | Freq: Every day | ORAL | 0 refills | Status: AC

## 2022-02-28 NOTE — Progress Notes (Signed)
   02/28/2022 4:05 PM   Tyler Watson 11-27-1970 [id number]  Reason for visit: Abdominal pain, dysuria  HPI: 52 year old male with long history of nephrolithiasis with 2 prior shockwave lithotripsies, problems with constipation, and urinary symptoms as well previously.  He presents today with about 4 weeks of intermittent abdominal and flank pain that moves from the left to the right side and is intermittent.  He also has some lower pelvic and abdominal pain.  He also reports some intermittent dysuria, that seems to be worse at night.  Notably, he was in [place] in December and had significant severe food poisoning with severe nausea and vomiting.  He denies any gross hematuria.  Urinalysis today essentially benign with few bacteria and 0-5 WBC, 0-5 RBC, no leukocytes, nitrite negative.  I also personally viewed and interpreted the KUB today that shows no definitive evidence of ureteral stones.  We discussed possible etiologies including pelvic floor dysfunction, constipation, prostatitis, nephrolithiasis.  I offered CT but he would like to hold off for the time being.  He was interested in trial of medication for his constipation, as this seems to have helped previously.  Trial of Senokot and Celebrex He will contact us via MyChart if no improvement, would recommend CT at that time if persistent symptoms   Billey Co, MD  [facility] [address] [place], [place] [address] [phone]

## 2022-03-02 LAB — URINE CULTURE: Culture: NO GROWTH

## 2022-03-07 LAB — MISC LABCORP TEST (SEND OUT): Labcorp test code: 86884

## 2022-10-30 ENCOUNTER — Other Ambulatory Visit: Payer: Self-pay | Admitting: *Deleted

## 2022-10-30 DIAGNOSIS — N200 Calculus of kidney: Secondary | ICD-10-CM

## 2022-10-31 ENCOUNTER — Ambulatory Visit: Payer: 59 | Admitting: Urology

## 2023-11-08 ENCOUNTER — Ambulatory Visit
Admission: EM | Admit: 2023-11-08 | Discharge: 2023-11-08 | Disposition: A | Discharge status: Home / Self Care | Attending: Emergency Medicine | Admitting: Emergency Medicine

## 2023-11-08 DIAGNOSIS — N390 Urinary tract infection, site not specified: Secondary | ICD-10-CM | POA: Diagnosis present

## 2023-11-08 LAB — URINALYSIS, W/ REFLEX TO CULTURE (INFECTION SUSPECTED)
Bilirubin Urine: NEGATIVE
Glucose, UA: NEGATIVE mg/dL
Nitrite: NEGATIVE
Protein, ur: NEGATIVE mg/dL
Specific Gravity, Urine: 1.015 (ref 1.005–1.030)
WBC, UA: >50 WBC/hpf (ref 0–5)
pH: 6 (ref 5.0–8.0)

## 2023-11-08 MED ORDER — SULFAMETHOXAZOLE-TRIMETHOPRIM 800-160 MG PO TABS: 1.0000 | ORAL_TABLET | Freq: Two times a day (BID) | ORAL | 0 refills | Status: AC

## 2023-11-08 NOTE — ED Triage Notes (Signed)
 Pt c/o possible UTI x1day  Pt c/o burning sensation when he urinates with urinary urgency  Pt denies urinary blood, drainage, or new sexual partners

## 2023-11-08 NOTE — Discharge Instructions (Addendum)
 Drink plenty of water Take antibiotic as directed, urine culture pending, if different antibiotic is needed you will be notified. Follow up with urologist-call for appt next week If you have new or worsening issues: fever, nausea,vomiting, flank pain,unable to keep meds down etc-go to ER for further evaluation

## 2023-11-08 NOTE — ED Provider Notes (Signed)
 [facility]    CSN: [id number] Arrival date & time: 11/08/23  1606      History   Chief Complaint Chief Complaint  Patient presents with  . Urinary Tract Infection    HPI Tyler Watson is a 53 y.o. male.   53 year old male pt, Tyler Watson, presents to urgent care for evaluation of possible UTI x 1 day.  Patient complaining of sensation when he urinates with urgency.  Patient denies any urinary blood drainage or new sexual partners. No abx last 30 days  PMH:  long history of nephrolithiasis with 2 prior shockwave lithotripsies, problems with constipation, and urinary symptoms as well previously,prostatitis, has urologist  The history is provided by the patient. No language interpreter was used.    Past Medical History:  Diagnosis Date  . Kidney stones     Patient Active Problem List   Diagnosis Date Noted  . Acute UTI 11/08/2023  . Peroneal tendinitis, left 08/05/2018  . History of nephrolithiasis 09/27/2013    Past Surgical History:  Procedure Laterality Date  . EXTRACORPOREAL SHOCK WAVE LITHOTRIPSY Right 01/17/2018   Procedure: EXTRACORPOREAL SHOCK WAVE LITHOTRIPSY (ESWL);  Surgeon: Penne Knee, MD;  Location: [facility];  Service: Urology;  Laterality: Right;  . EXTRACORPOREAL SHOCK WAVE LITHOTRIPSY Left 09/01/2021   Procedure: EXTRACORPOREAL SHOCK WAVE LITHOTRIPSY (ESWL);  Surgeon: Penne Knee, MD;  Location: [facility];  Service: Urology;  Laterality: Left;  . LITHOTRIPSY         Home Medications    Prior to Admission medications   Medication Sig Start Date End Date Taking? Authorizing Provider  celecoxib  (CELEBREX ) 200 MG capsule Take 1 capsule (200 mg total) by mouth 2 (two) times daily. 02/28/22  Yes Francisca Redell BROCKS, MD  senna-docusate (SENOKOT S) 8.6-50 MG tablet Take 1 tablet by mouth daily. 02/28/22  Yes Francisca Redell BROCKS, MD  sulfamethoxazole -trimethoprim  (BACTRIM  DS) 800-160 MG tablet Take 1 tablet by mouth 2 (two) times daily for  14 days. 11/08/23 11/22/23 Yes Rindi Beechy, Rilla, NP    Family History Family History  Problem Relation Age of Onset  . Hypertension Mother   . Heart attack Father 39    Social History Social History   Tobacco Use  . Smoking status: Never    Passive exposure: Never  . Smokeless tobacco: Never  Vaping Use  . Vaping status: Never Used  Substance Use Topics  . Alcohol use: Not Currently  . Drug use: Never     Allergies   Patient has no known allergies.   Review of Systems Review of Systems  Constitutional:  Negative for fever.  Genitourinary:  Positive for dysuria, frequency and urgency.  All other systems reviewed and are negative.    Physical Exam Triage Vital Signs ED Triage Vitals  Encounter Vitals Group     BP 11/08/23 1622 116/81     Girls Systolic BP Percentile --      Girls Diastolic BP Percentile --      Boys Systolic BP Percentile --      Boys Diastolic BP Percentile --      Pulse Rate 11/08/23 1622 96     Resp --      Temp 11/08/23 1622 99.1 F (37.3 C)     Temp Source 11/08/23 1622 Oral     SpO2 11/08/23 1622 99 %     Weight 11/08/23 1623 188 lb 12.8 oz (85.6 kg)     Height --      Head Circumference --  Peak Flow --      Pain Score 11/08/23 1623 10     Pain Loc --      Pain Education --      Exclude from Growth Chart --    No data found.  Updated Vital Signs BP 116/81 (BP Location: Left Arm)   Pulse 96   Temp 99.1 F (37.3 C) (Oral)   Wt 188 lb 12.8 oz (85.6 kg)   SpO2 99%   BMI 29.57 kg/m   Visual Acuity Right Eye Distance:   Left Eye Distance:   Bilateral Distance:    Right Eye Near:   Left Eye Near:    Bilateral Near:     Physical Exam Vitals and nursing note reviewed.  Constitutional:      General: He is not in acute distress.    Appearance: He is well-developed and well-groomed.  HENT:     Head: Normocephalic and atraumatic.  Eyes:     Conjunctiva/sclera: Conjunctivae normal.  Cardiovascular:     Rate and  Rhythm: Normal rate.     Heart sounds: No murmur heard. Pulmonary:     Effort: Pulmonary effort is normal. No respiratory distress.  Abdominal:     Palpations: Abdomen is soft.     Tenderness: There is no abdominal tenderness.  Musculoskeletal:        General: No swelling.     Cervical back: Neck supple.  Skin:    General: Skin is warm and dry.     Capillary Refill: Capillary refill takes less than 2 seconds.  Neurological:     General: No focal deficit present.     Mental Status: He is alert and oriented to person, place, and time.     GCS: GCS eye subscore is 4. GCS verbal subscore is 5. GCS motor subscore is 6.  Psychiatric:        Attention and Perception: Attention normal.        Mood and Affect: Mood normal.        Speech: Speech normal.        Behavior: Behavior normal. Behavior is cooperative.      UC Treatments / Results  Labs (all labs ordered are listed, but only abnormal results are displayed) Labs Reviewed  URINALYSIS, W/ REFLEX TO CULTURE (INFECTION SUSPECTED) - Abnormal; Notable for the following components:      Result Value   APPearance HAZY (*)    Hgb urine dipstick TRACE (*)    Ketones, ur TRACE (*)    Leukocytes,Ua SMALL (*)    Bacteria, UA FEW (*)    All other components within normal limits  URINE CULTURE    EKG   Radiology No results found.  Procedures Procedures (including critical care time)  Medications Ordered in UC Medications - No data to display  Initial Impression / Assessment and Plan / UC Course  I have reviewed the triage vital signs and the nursing notes.  Pertinent labs & imaging results that were available during my care of the patient were reviewed by me and considered in my medical decision making (see chart for details).  Clinical Course as of 11/08/23 1713  Thu Nov 08, 2023  1712 Wbc>50, small ketones, +small leuks, trace hgb, urine culture pending [JD]    Clinical Course User Index [JD] Math Brazie, Rilla, NP    Discussed exam findings and plan of care with patient, urine culture pending, scripted Bactrim , push fluids, strict go to ER precautions given.   Patient verbalized understanding  to this provider.  Ddx: Acute uti, urethritis, prostatitis,dehydration Final Clinical Impressions(s) / UC Diagnoses   Final diagnoses:  Acute UTI     Discharge Instructions      Drink plenty of water Take antibiotic as directed, urine culture pending, if different antibiotic is needed you will be notified. Follow up with urologist-call for appt next week If you have new or worsening issues: fever, nausea,vomiting, flank pain,unable to keep meds down etc-go to ER for further evaluation     ED Prescriptions     Medication Sig Dispense Auth. Provider   sulfamethoxazole -trimethoprim  (BACTRIM  DS) 800-160 MG tablet Take 1 tablet by mouth 2 (two) times daily for 14 days. 28 tablet Mahitha Hickling, Rilla, NP      PDMP not reviewed this encounter.   Aminta Rilla, NP 11/08/23 [phone]

## 2023-11-10 LAB — URINE CULTURE: Culture: 70000 — AB

## 2023-11-12 ENCOUNTER — Ambulatory Visit (HOSPITAL_COMMUNITY): Payer: Self-pay
